# Patient Record
Sex: Male | Born: 1950 | Race: Black or African American | Hispanic: No | Marital: Married | Smoking: Current every day smoker
Health system: Southern US, Community
[De-identification: ages and names within clinical notes are randomized; demographics above are authoritative.]

## PROBLEM LIST (undated history)

## (undated) DIAGNOSIS — G8929 Other chronic pain: Secondary | ICD-10-CM

## (undated) DIAGNOSIS — R569 Unspecified convulsions: Secondary | ICD-10-CM

## (undated) DIAGNOSIS — I10 Essential (primary) hypertension: Secondary | ICD-10-CM

## (undated) DIAGNOSIS — M542 Cervicalgia: Secondary | ICD-10-CM

## (undated) DIAGNOSIS — I739 Peripheral vascular disease, unspecified: Secondary | ICD-10-CM

## (undated) DIAGNOSIS — I639 Cerebral infarction, unspecified: Secondary | ICD-10-CM

## (undated) DIAGNOSIS — K219 Gastro-esophageal reflux disease without esophagitis: Secondary | ICD-10-CM

## (undated) DIAGNOSIS — F419 Anxiety disorder, unspecified: Secondary | ICD-10-CM

## (undated) HISTORY — PX: NECK SURGERY: SHX720

## (undated) HISTORY — PX: TOE AMPUTATION: SHX809

## (undated) HISTORY — PX: CHOLECYSTECTOMY: SHX55

## (undated) HISTORY — PX: FEMORAL-POPLITEAL BYPASS GRAFT: SHX937

---

## 2001-12-30 ENCOUNTER — Ambulatory Visit (HOSPITAL_COMMUNITY): Admission: RE | Admit: 2001-12-30 | Discharge: 2001-12-30 | Payer: Self-pay | Admitting: Pulmonary Disease

## 2002-01-07 ENCOUNTER — Ambulatory Visit (HOSPITAL_COMMUNITY): Admission: RE | Admit: 2002-01-07 | Discharge: 2002-01-07 | Payer: Self-pay | Admitting: Pulmonary Disease

## 2002-01-15 ENCOUNTER — Ambulatory Visit (HOSPITAL_COMMUNITY): Admission: RE | Admit: 2002-01-15 | Discharge: 2002-01-15 | Payer: Self-pay | Admitting: Pulmonary Disease

## 2002-06-12 ENCOUNTER — Encounter: Payer: Self-pay | Admitting: Emergency Medicine

## 2002-06-12 ENCOUNTER — Emergency Department (HOSPITAL_COMMUNITY): Admission: EM | Admit: 2002-06-12 | Discharge: 2002-06-12 | Payer: Self-pay | Admitting: Emergency Medicine

## 2003-04-12 ENCOUNTER — Emergency Department (HOSPITAL_COMMUNITY): Admission: EM | Admit: 2003-04-12 | Discharge: 2003-04-12 | Payer: Self-pay | Admitting: Emergency Medicine

## 2003-04-12 ENCOUNTER — Encounter: Payer: Self-pay | Admitting: Emergency Medicine

## 2003-04-23 ENCOUNTER — Ambulatory Visit (HOSPITAL_COMMUNITY): Admission: RE | Admit: 2003-04-23 | Discharge: 2003-04-23 | Payer: Self-pay | Admitting: Internal Medicine

## 2003-04-23 ENCOUNTER — Encounter: Payer: Self-pay | Admitting: Internal Medicine

## 2003-06-15 ENCOUNTER — Ambulatory Visit (HOSPITAL_COMMUNITY): Admission: RE | Admit: 2003-06-15 | Discharge: 2003-06-16 | Payer: Self-pay | Admitting: Neurosurgery

## 2003-06-15 ENCOUNTER — Encounter: Payer: Self-pay | Admitting: Neurosurgery

## 2003-07-06 ENCOUNTER — Encounter: Payer: Self-pay | Admitting: Neurosurgery

## 2003-07-06 ENCOUNTER — Encounter: Admission: RE | Admit: 2003-07-06 | Discharge: 2003-07-06 | Payer: Self-pay | Admitting: Neurosurgery

## 2004-01-17 ENCOUNTER — Ambulatory Visit (HOSPITAL_COMMUNITY): Admission: RE | Admit: 2004-01-17 | Discharge: 2004-01-17 | Payer: Self-pay | Admitting: Neurosurgery

## 2012-03-25 ENCOUNTER — Emergency Department (HOSPITAL_COMMUNITY): Payer: Non-veteran care

## 2012-03-25 ENCOUNTER — Other Ambulatory Visit: Payer: Self-pay

## 2012-03-25 ENCOUNTER — Inpatient Hospital Stay (HOSPITAL_COMMUNITY)
Admission: EM | Admit: 2012-03-25 | Discharge: 2012-03-26 | DRG: 069 | Disposition: A | Discharge status: Home / Self Care | Payer: Non-veteran care | Source: Ambulatory Visit | Attending: Internal Medicine | Admitting: Internal Medicine

## 2012-03-25 ENCOUNTER — Encounter (HOSPITAL_COMMUNITY): Payer: Self-pay

## 2012-03-25 ENCOUNTER — Inpatient Hospital Stay (HOSPITAL_COMMUNITY): Payer: Non-veteran care

## 2012-03-25 DIAGNOSIS — E119 Type 2 diabetes mellitus without complications: Secondary | ICD-10-CM

## 2012-03-25 DIAGNOSIS — I10 Essential (primary) hypertension: Secondary | ICD-10-CM

## 2012-03-25 DIAGNOSIS — F1010 Alcohol abuse, uncomplicated: Secondary | ICD-10-CM | POA: Diagnosis present

## 2012-03-25 DIAGNOSIS — R569 Unspecified convulsions: Secondary | ICD-10-CM

## 2012-03-25 DIAGNOSIS — F17200 Nicotine dependence, unspecified, uncomplicated: Secondary | ICD-10-CM | POA: Diagnosis present

## 2012-03-25 DIAGNOSIS — R55 Syncope and collapse: Secondary | ICD-10-CM

## 2012-03-25 DIAGNOSIS — Z7289 Other problems related to lifestyle: Secondary | ICD-10-CM

## 2012-03-25 DIAGNOSIS — Z720 Tobacco use: Secondary | ICD-10-CM

## 2012-03-25 DIAGNOSIS — G459 Transient cerebral ischemic attack, unspecified: Principal | ICD-10-CM | POA: Diagnosis present

## 2012-03-25 HISTORY — DX: Cerebral infarction, unspecified: I63.9

## 2012-03-25 HISTORY — DX: Essential (primary) hypertension: I10

## 2012-03-25 LAB — COMPREHENSIVE METABOLIC PANEL
BUN: 8 mg/dL (ref 6–23)
CO2: 25 mEq/L (ref 19–32)
Chloride: 96 mEq/L (ref 96–112)
Creatinine, Ser: 0.82 mg/dL (ref 0.50–1.35)
GFR calc non Af Amer: >90 mL/min (ref >90)
Total Bilirubin: 0.3 mg/dL (ref 0.3–1.2)

## 2012-03-25 LAB — POCT I-STAT, CHEM 8
BUN: 8 mg/dL (ref 6–23)
Calcium, Ion: 1.22 mmol/L (ref 1.12–1.32)
Creatinine, Ser: 0.8 mg/dL (ref 0.50–1.35)
TCO2: 25 mmol/L (ref 0–100)

## 2012-03-25 LAB — CBC
HCT: 39.3 % (ref 39.0–52.0)
HCT: 38.6 % — ABNORMAL LOW (ref 39.0–52.0)
Hemoglobin: 13 g/dL (ref 13.0–17.0)
MCHC: 33.1 g/dL (ref 30.0–36.0)
MCHC: 32.1 g/dL (ref 30.0–36.0)
MCV: 76.6 fL — ABNORMAL LOW (ref 78.0–100.0)
MCV: 76.3 fL — ABNORMAL LOW (ref 78.0–100.0)
Platelets: 246 10*3/uL (ref 150–400)
RDW: 15.3 % (ref 11.5–15.5)
WBC: 7.1 10*3/uL (ref 4.0–10.5)

## 2012-03-25 LAB — DIFFERENTIAL
Basophils Relative: 0 % (ref 0–1)
Eosinophils Relative: 1 % (ref 0–5)
Lymphocytes Relative: 17 % (ref 12–46)
Monocytes Absolute: 0.8 10*3/uL (ref 0.1–1.0)
Monocytes Relative: 12 % (ref 3–12)
Neutro Abs: 4.8 10*3/uL (ref 1.7–7.7)

## 2012-03-25 LAB — APTT: aPTT: 25 seconds (ref 24–37)

## 2012-03-25 LAB — CARDIAC PANEL(CRET KIN+CKTOT+MB+TROPI)
CK, MB: 4.1 ng/mL — ABNORMAL HIGH (ref 0.3–4.0)
Relative Index: 0.9 (ref 0.0–2.5)
Relative Index: 0.5 (ref 0.0–2.5)
Total CK: 863 U/L — ABNORMAL HIGH (ref 7–232)
Troponin I: <0.3 ng/mL (ref <0.30)
Troponin I: <0.3 ng/mL (ref <0.30)

## 2012-03-25 LAB — CK TOTAL AND CKMB (NOT AT ARMC)
CK, MB: 3.3 ng/mL (ref 0.3–4.0)
Total CK: 199 U/L (ref 7–232)

## 2012-03-25 LAB — RAPID URINE DRUG SCREEN, HOSP PERFORMED
Barbiturates: NOT DETECTED
Tetrahydrocannabinol: NOT DETECTED

## 2012-03-25 LAB — GLUCOSE, CAPILLARY
Glucose-Capillary: 105 mg/dL — ABNORMAL HIGH (ref 70–99)
Glucose-Capillary: 124 mg/dL — ABNORMAL HIGH (ref 70–99)

## 2012-03-25 MED ORDER — ACETAMINOPHEN 325 MG PO TABS: 650.0000 mg | ORAL_TABLET | ORAL | Status: DC | PRN

## 2012-03-25 MED ORDER — OXYBUTYNIN CHLORIDE 5 MG PO TABS
5.0000 mg | ORAL_TABLET | Freq: Two times a day (BID) | ORAL | Status: DC
  Administered 2012-03-25 – 2012-03-26 (×2): 5 mg via ORAL
  Filled 2012-03-25 (×3): qty 1

## 2012-03-25 MED ORDER — BUPROPION HCL ER (SR) 150 MG PO TB12
150.0000 mg | ORAL_TABLET | Freq: Two times a day (BID) | ORAL | Status: DC
  Administered 2012-03-25 – 2012-03-26 (×2): 150 mg via ORAL
  Filled 2012-03-25 (×3): qty 1

## 2012-03-25 MED ORDER — IPRATROPIUM-ALBUTEROL 18-103 MCG/ACT IN AERO
2.0000 | INHALATION_SPRAY | Freq: Two times a day (BID) | RESPIRATORY_TRACT | Status: DC | PRN
  Administered 2012-03-25: 2 via RESPIRATORY_TRACT
  Filled 2012-03-25 (×2): qty 14.7

## 2012-03-25 MED ORDER — SODIUM CHLORIDE 0.9 % IV SOLN
INTRAVENOUS | Status: DC
  Administered 2012-03-25: 14:00:00 via INTRAVENOUS

## 2012-03-25 MED ORDER — ASPIRIN 325 MG PO TABS
325.0000 mg | ORAL_TABLET | Freq: Every day | ORAL | Status: DC
  Administered 2012-03-26: 325 mg via ORAL
  Filled 2012-03-25: qty 1

## 2012-03-25 MED ORDER — NICOTINE 14 MG/24HR TD PT24
14.0000 mg | MEDICATED_PATCH | Freq: Every day | TRANSDERMAL | Status: DC
  Administered 2012-03-25 – 2012-03-26 (×2): 14 mg via TRANSDERMAL
  Filled 2012-03-25 (×2): qty 1

## 2012-03-25 MED ORDER — INSULIN ASPART 100 UNIT/ML ~~LOC~~ SOLN: 0.0000 [IU] | Freq: Three times a day (TID) | SUBCUTANEOUS | Status: DC

## 2012-03-25 MED ORDER — WHITE PETROLATUM GEL
Status: AC
  Filled 2012-03-25: qty 5

## 2012-03-25 MED ORDER — INSULIN ASPART 100 UNIT/ML ~~LOC~~ SOLN
0.0000 [IU] | SUBCUTANEOUS | Status: DC
  Administered 2012-03-25: 1 [IU] via SUBCUTANEOUS

## 2012-03-25 MED ORDER — ATORVASTATIN CALCIUM 10 MG PO TABS
10.0000 mg | ORAL_TABLET | Freq: Every day | ORAL | Status: DC
  Administered 2012-03-25: 10 mg via ORAL
  Filled 2012-03-25 (×4): qty 1

## 2012-03-25 MED ORDER — GADOBENATE DIMEGLUMINE 529 MG/ML IV SOLN
20.0000 mL | Freq: Once | INTRAVENOUS | Status: AC
  Administered 2012-03-25: 20 mL via INTRAVENOUS

## 2012-03-25 MED ORDER — ENOXAPARIN SODIUM 40 MG/0.4ML ~~LOC~~ SOLN
40.0000 mg | SUBCUTANEOUS | Status: DC
  Administered 2012-03-25: 40 mg via SUBCUTANEOUS
  Filled 2012-03-25 (×2): qty 0.4

## 2012-03-25 MED ORDER — ASPIRIN EC 81 MG PO TBEC
81.0000 mg | DELAYED_RELEASE_TABLET | Freq: Every day | ORAL | Status: DC
  Administered 2012-03-25: 81 mg via ORAL
  Filled 2012-03-25: qty 1

## 2012-03-25 NOTE — H&P (Signed)
Hospital Admission Note Date: 03/25/2012  Patient name: Ruben Lloyd Medical record number: [id number] Date of birth: 1951-06-23 Age: 61 y.o. Gender: male PCP: No primary provider on file.  Medical Service: Maurice March  Attending physician: Dr. Franky Macho Hours (7AM-5PM):  First Contact: Dr. Clyde Lundborg                            Pager: [phone] Second Contact: Dr. Saralyn Pilar    Pager: [phone]  After Hours (after 5PM)/ Weekend / Holidays: First Contact: Pager: [phone] Second Contact: Pager: [phone]   Chief Complaint: Blurry vision, LOC  History of Present Illness: Patient is a 61 year old male with a past medical history of hypertension, diabetes, alcohol and tobacco use, woke up this morning and normal state of health, later that morning he developed blurry vision, patient proceeded to go to the store, when he returned home he went to the mailbox, when he had a witnessed syncopal episode and some shaking motion noted by his wife. Patient's wife called EMS which transported the patient to [facility] emergency room. Patient reported that he regained consciousness during the ambulance ride, denied any confusion, palpitation, shortness of breath, chest pain, diplopia or headache. Underwent head CT that showed an old area of ischemia in the right occipital lobe, no sign of acute bleed, no masses noted. The patient had returned to his baseline function no TPA was considered by neurology, but recommended admission for further workup of possible transient ischemic attack.   Meds: Medication List  As of 03/25/2012 10:23 AM   ASK your doctor about these medications         albuterol-ipratropium 18-103 MCG/ACT inhaler   Commonly known as: COMBIVENT   Inhale 2 puffs into the lungs 2 (two) times daily as needed. For shortness of breath      buPROPion 150 MG 12 hr tablet   Commonly known as: WELLBUTRIN SR   Take 150 mg by mouth 2 (two) times daily.      ibuprofen 800 MG tablet   Commonly known as:  ADVIL,MOTRIN   Take 800 mg by mouth 2 (two) times daily as needed.      oxybutynin 5 MG tablet   Commonly known as: DITROPAN   Take 5 mg by mouth 2 (two) times daily.      PRESCRIPTION MEDICATION   Take 1 tablet by mouth. Blood Pressure Medication from [facility]           Allergies: Allergies as of 03/25/2012  . (Not on File)   Past Medical History  Diagnosis Date  . Hypertension   . Diabetes mellitus   . Stroke    No past surgical history on file. No family history on file. History   Social History  . Marital Status: Married    Spouse Name: N/A    Number of Children: N/A  . Years of Education: N/A   Occupational History  . Not on file.   Social History Main Topics  . Smoking status: Current Everyday Smoker  . Smokeless tobacco: Not on file  . Alcohol Use: Yes     daily  . Drug Use:   . Sexually Active:    Other Topics Concern  . Not on file   Social History Narrative  . No narrative on file    Review of Systems: Negative except per history of present illness  Physical Exam: Blood pressure 149/80, pulse 90, temperature 98.6 F (  37 C), temperature source Oral, resp. rate 20, SpO2 100.00%. General:  alert, well-developed, and cooperative to examination.   Head:  normocephalic and atraumatic.   Eyes:  vision grossly intact, pupils equal, pupils round, pupils reactive to light, no injection and anicteric.   Mouth:  pharynx pink and moist, no erythema, and no exudates.   Neck:  supple, full ROM, no thyromegaly, no JVD, and no carotid bruits.   Lungs:  normal respiratory effort, no accessory muscle use, normal breath sounds, no crackles, and no wheezes. Heart:  normal rate, regular rhythm, no murmur, no gallop, and no rub.   Abdomen:  soft, non-tender, normal bowel sounds, no distention, no guarding, no rebound tenderness, no hepatomegaly, and no splenomegaly.   Msk:  no joint swelling, no joint warmth, and no redness over joints.   Pulses:  2+ DP/PT  pulses bilaterally Extremities:  No cyanosis, clubbing, edema Neurologic:  alert & oriented X3, cranial nerves II-XII intact, strength normal in all extremities, sensation intact to light touch, and gait normal.   Skin:  turgor normal and no rashes.   Psych:  Oriented X3, memory intact for recent and remote, normally interactive, good eye contact, not anxious appearing, and not depressed appearing.  Lab results: Basic Metabolic Panel:  Basename 03/25/12 0913 03/25/12 0853  NA 136 133*  K 4.1 4.1  CL 102 96  CO2 -- 25  GLUCOSE 124* 116*  BUN 8 8  CREATININE 0.80 0.82  CALCIUM -- 9.8  MG -- --  PHOS -- --   Liver Function Tests:  [facility] 03/25/12 0853  AST 18  ALT 9  ALKPHOS 78  BILITOT 0.3  PROT 8.0  ALBUMIN 3.9   CBC:  Basename 03/25/12 0913 03/25/12 0853  WBC -- 6.9  NEUTROABS -- 4.8  HGB 15.0 13.0  HCT 44.0 39.3  MCV -- 76.6*  PLT -- 262   Cardiac Enzymes:  Basename 03/25/12 0853  CKTOTAL PENDING  CKMB 3.3  CKMBINDEX --  TROPONINI <0.30   Coagulation:  Basename 03/25/12 0853  LABPROT 13.1  INR 0.97   Urine Drug Screen: Pending>>  Imaging results:  Ct Head Wo Contrast  03/25/2012  *RADIOLOGY REPORT*  Clinical Data: 61 year old male Code stroke.  Dizziness, syncope, confusion, incontinence.  CT HEAD WITHOUT CONTRAST  Technique:  Contiguous axial images were obtained from the base of the skull through the vertex without contrast.  Comparison: Cervical MRI 01/17/2004.  Findings: Visualized paranasal sinuses and mastoids are clear.  No acute orbit or scalp soft tissue findings.  Small right scalp lipoma.  Mild Calcified atherosclerosis at the skull base.  No ventriculomegaly. No midline shift, mass effect, or evidence of mass lesion.  No acute intracranial hemorrhage identified.  Chronic right parietal lobe infarct on series 2 image 18. Heterogeneously hypodense right occipital pole on image 13.  No associated mass effect or hemorrhage.  Suggestion of a small  lacunar infarct of the right thalamus also on image 13.  Elsewhere gray white matter differentiation within normal limits. No suspicious intracranial vascular hyperdensity.  IMPRESSION: 1.  Small chronic right parietal lobe infarct with nearby age indeterminate right occipital infarct (image 13).  No mass effect or hemorrhage. 2.  Evidence of a small lacunar infarct of the right thalamus, also age indeterminate.  Study discussed by telephone with Dr. Nelva Nay on 03/25/2012 at 0910 hours.  Original Report Authenticated By: Harley Hallmark, M.D.    Other results: EKG: Sinus rhythm, right bundle branch block, no definite ischemic  changes noted, no prior EKGs to compare.  Assessment & Plan by Problem:  TRANSIENT ISCHEMIC ATTACK: Total event with questionable seizure activity with resolution of symptoms within one hour after presentation suggest TIA as most likely etiology. Patient's risk factors for vascular disease include hypertension, diabetes, regular tobacco and Alcohol use. Patient's ABCD2 score is 3, which puts him at low-moderate or 3.1% risk of progression to stroke at 90 days.  Plan -Admit to Neuro floor with telemetry and core measures recommended protocol -Order brain MRI/MRA per neurology recs, if new stoke present, will replace ASA with Plavix.  -Will further evaluate for embolic source by ordering carotid dopplers, and 2D echo.   -Start patient on ASA and statin empirically and check AM fasting lipid.  -Other etiologies for brain ischemia include cardiac ischemia or cardiac arrhythmia, therefore will check cardiac enzymes x3, as patient may be having silent MI which is increased in patient with coronary risk factors (eg, smoking, family history of heart disease, age over 45 years, diabetes, hypertension, and hypercholesterolemia).   HTN: Hold antihypertensives to allow for permissive hypertension in the setting of possible new stroke.   Tobacco use: smokes 1PPD, place the patient on  nicotine patch and obtain social work consult for cessation counseling.   DM2: Diet-controlled at home, check A1c and place the patient on sliding scale insulin.  Alcohol use: Place patient on CIWA  DVT: Lovenox   Signed: Nillie Bartolotta 03/25/2012, 10:09 AM

## 2012-03-25 NOTE — ED Notes (Signed)
Paramedics arrived to pt.s home and pt.s wife reported that pt. Went to the store this am,. Came home was in the yard and pt. Was shaking and incontinent of urine.  Wife brought pt. To the couch and he became unreponsive for a few seconds. When paramedic arrived pt. Was having lt. Arm weakness and rt. Sided glare. And a BP of 210/100.   Upon arrival to ED pt. Has no weakness noted to extremeties, pt. Is alert and oriented X3.

## 2012-03-25 NOTE — Code Documentation (Signed)
Code stroke called 0829 Code stroke encoded 0830 Patient arrival 0846 EDP 0846 Stroke team arrival 0840 LSN 0730 Pt arrival in [place] [phone] Phlebotomist arrival [phone]
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
## 2012-03-25 NOTE — Consult Note (Signed)
[facility] CONSULT NOTE     Reason for Consult: rule out stroke    HPI:    Ruben Lloyd is an 61 y.o. male who was in his usual state of health on arising this morn.  When he returned from a store back home around 7:45AM, he remembers going to the mailbox but then not after that.  His wife told others that she saw him in the yard passed out and with some shaking on something.  His wife is in route and not available to get a better history of what she saw.  I was told she helped him into the house and then he became unresponsive.  EMS was called and he was transported to [facility] ED.  He is denying and focal weakness or change in sensations, problems with vision or side vision, spinning, nausea, vomiting, palpitations or racing heart, chest pain (does have occasional gas), diplopia, headache (except a mild dullness bifrontally).  In the ED, he underwent a head CT (personally reviewed) that showed an old area of ischemia in the right occipital lobe with some mixed densities, but appears old; no shift; no ICH; no masses.  He says he feels at his baseline except speech "a little sluggish."  No tpa was considered as his exam was unremarkable and it wasn't clear whether he had had a seizure or just some shaking or just passed out.  He normally receives his care at a [facility].  PMH: HTN Smoker Denies diabetes (except said told to watch diet); denies CAD. Denies any previous stroke symptoms or change in vision or side vision.  No past surgical history on file.  No family history on file.   Social History: Positive for tobacco use and smokeless cigarette use.  Negative for alcohol or drug abuse.  Lives with his wife in [place], [place].  Allergies not on file  Medications:    1.  BP pill 2.  ASA 81 mg/day Full list pending.  Prior to Admission:  (Not in a hospital admission)  Review of Systems - see HPI.  Blood pressure 149/80, pulse 90, temperature 98.6  F (37 C), temperature source Oral, resp. rate 20, SpO2 100.00%. O2 sat on RA was 88%.  Placed on O2 via [place].  Neurologic Examination:   Awake, alert, INAD.  Oriented to person, place, follows all directions.  Language normal.  No significant dysarthria but he feels his speech may be a little sluggish.  No scanning or apraxic speech.  Does not look directly at the examiner. No vertebral bruits auscultated. PERRL.  EOM full.  + nystagmus on looking to the right with fast phase to the right.  Same minimally true looking to the left.  VFF to DSS by confrontation.  No facial asymmetry. No ptosis.  Light touch and temperature intact and symmetrical V2 areas.  Hearing intact to general conversation.   Power 5/5 proximally and distally in the UEs and LEs.  Bulk and tone normal. Light touch, temperature and vibration intact and symmetrical in the UEs and LEs. DTRs trace at the knees and not elicitable at the biceps.  Normal FTN without any dysmetria.  No tremors noted.    Results for orders placed during the hospital encounter of 03/25/12 (from the past 48 hour(s))  PROTIME-INR     Status: Normal   Collection Time   03/25/12  8:53 AM      Component Value Range Comment   Prothrombin Time 13.1  11.6 - 15.2 (seconds)    INR 0.97  0.00 - 1.49    APTT     Status: Normal   Collection Time   03/25/12  8:53 AM      Component Value Range Comment   aPTT 25  24 - 37 (seconds)   CBC     Status: Abnormal   Collection Time   03/25/12  8:53 AM      Component Value Range Comment   WBC 6.9  4.0 - 10.5 (K/uL)    RBC 5.13  4.22 - 5.81 (MIL/uL)    Hemoglobin 13.0  13.0 - 17.0 (g/dL)    HCT 16.1  09.6 - 04.5 (%)    MCV 76.6 (*) 78.0 - 100.0 (fL)    MCH 25.3 (*) 26.0 - 34.0 (pg)    MCHC 33.1  30.0 - 36.0 (g/dL)    RDW 40.9  81.1 - 91.4 (%)    Platelets 262  150 - 400 (K/uL)   DIFFERENTIAL     Status: Normal   Collection Time   03/25/12  8:53 AM      Component Value Range Comment   Neutrophils Relative 69  43 -  77 (%)    Neutro Abs 4.8  1.7 - 7.7 (K/uL)    Lymphocytes Relative 17  12 - 46 (%)    Lymphs Abs 1.2  0.7 - 4.0 (K/uL)    Monocytes Relative 12  3 - 12 (%)    Monocytes Absolute 0.8  0.1 - 1.0 (K/uL)    Eosinophils Relative 1  0 - 5 (%)    Eosinophils Absolute 0.1  0.0 - 0.7 (K/uL)    Basophils Relative 0  0 - 1 (%)    Basophils Absolute 0.0  0.0 - 0.1 (K/uL)   CK TOTAL AND CKMB     Status: Normal (Preliminary result)   Collection Time   03/25/12  8:53 AM      Component Value Range Comment   Total CK PENDING  7 - 232 (U/L)    CK, MB 3.3  0.3 - 4.0 (ng/mL)    Relative Index PENDING  0.0 - 2.5    TROPONIN I     Status: Normal   Collection Time   03/25/12  8:53 AM      Component Value Range Comment   Troponin I <0.30  <0.30 (ng/mL)   POCT I-STAT, CHEM 8     Status: Abnormal   Collection Time   03/25/12  9:13 AM      Component Value Range Comment   Sodium 136  135 - 145 (mEq/L)    Potassium 4.1  3.5 - 5.1 (mEq/L)    Chloride 102  96 - 112 (mEq/L)    BUN 8  6 - 23 (mg/dL)    Creatinine, Ser 7.82  0.50 - 1.35 (mg/dL)    Glucose, Bld 956 (*) 70 - 99 (mg/dL)    Calcium, Ion 2.13  1.12 - 1.32 (mmol/L)    TCO2 25  0 - 100 (mmol/L)    Hemoglobin 15.0  13.0 - 17.0 (g/dL)    HCT 08.6  57.8 - 46.9 (%)    EKG:  RBBB.  Ct Head Wo Contrast  03/25/2012  *RADIOLOGY REPORT*  Clinical Data: 61 year old male Code stroke.  Dizziness, syncope, confusion, incontinence.  CT HEAD WITHOUT CONTRAST  Technique:  Contiguous axial images were obtained from  the base of the skull through the vertex without contrast.  Comparison: Cervical MRI 01/17/2004.  Findings: Visualized paranasal sinuses and mastoids are clear.  No acute orbit or scalp soft tissue findings.  Small right scalp lipoma.  Mild Calcified atherosclerosis at the skull base.  No ventriculomegaly. No midline shift, mass effect, or evidence of mass lesion.  No acute intracranial hemorrhage identified.  Chronic right parietal lobe infarct on series 2 image  18. Heterogeneously hypodense right occipital pole on image 13.  No associated mass effect or hemorrhage.  Suggestion of a small lacunar infarct of the right thalamus also on image 13.  Elsewhere gray white matter differentiation within normal limits. No suspicious intracranial vascular hyperdensity.  IMPRESSION: 1.  Small chronic right parietal lobe infarct with nearby age indeterminate right occipital infarct (image 13).  No mass effect or hemorrhage. 2.  Evidence of a small lacunar infarct of the right thalamus, also age indeterminate.  Study discussed by telephone with Dr. Nelva Nay on 03/25/2012 at 0910 hours.  Original Report Authenticated By: Harley Hallmark, M.D.     Assessment/Plan:   1.  Syncopy vs seizure vs new CNS ischemic event.  Regarding the latter, he doesn't really have much on exam to suggest permanent ischemia (except there is some nystagmus).  He is denying any clinical stroke symptoms.  Event occurred on ASA. 2.  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  HTN 4.  Hyperglycemia  Recommendations: 1.  MRI Brain with and without dye today. 2.  Telemetry monitoring. 3.  HgA1c and FLP. 4.  Glucose monitoring. 5.  BP goal of SBP > or = 140 and < or = 180. 6.  Neuro checks. 7.  Further pending results of the above. 8.  If MRI suggests new cerebral ischemia, consider change to Plavix as event occurred on ASA.   Hayden Rasmussen, MD [facility] [phone]  03/25/2012, 9:43 AM

## 2012-03-25 NOTE — Consult Note (Signed)
Pt smokes 1 ppd and wants to quit. He would like to quit on patches and is in action stage. Recommended for pt to start with 21 mg patch. Discussed patch use instructions and how to taper. Pt verbalizes understanding. Referred to 1-800 quit now for f/u and support. Discussed oral fixation substitutes, second hand smoke and in home smoking policy. Reviewed and gave pt Written education/contact information.

## 2012-03-25 NOTE — ED Notes (Signed)
Pt. Is alert to himself  And place, but is confused to time.   Pt. Does follow commands

## 2012-03-25 NOTE — ED Notes (Signed)
Spoke with French Ana , RN on 3000, pt. To be transferrred to MRI and then they will call her when he is done.

## 2012-03-26 DIAGNOSIS — I517 Cardiomegaly: Secondary | ICD-10-CM

## 2012-03-26 DIAGNOSIS — R55 Syncope and collapse: Secondary | ICD-10-CM

## 2012-03-26 LAB — LIPID PANEL
HDL: 45 mg/dL (ref >39)
LDL Cholesterol: 113 mg/dL — ABNORMAL HIGH (ref 0–99)
Total CHOL/HDL Ratio: 4.1 RATIO
Triglycerides: 129 mg/dL (ref <150)

## 2012-03-26 LAB — BASIC METABOLIC PANEL
BUN: 10 mg/dL (ref 6–23)
Chloride: 98 mEq/L (ref 96–112)
GFR calc Af Amer: >90 mL/min (ref >90)
Glucose, Bld: 93 mg/dL (ref 70–99)
Potassium: 4 mEq/L (ref 3.5–5.1)
Sodium: 135 mEq/L (ref 135–145)

## 2012-03-26 LAB — GLUCOSE, CAPILLARY: Glucose-Capillary: 107 mg/dL — ABNORMAL HIGH (ref 70–99)

## 2012-03-26 MED ORDER — ATORVASTATIN CALCIUM 10 MG PO TABS: 10.0000 mg | ORAL_TABLET | Freq: Every day | ORAL | Status: DC

## 2012-03-26 MED ORDER — ASPIRIN 325 MG PO TABS: 325.0000 mg | ORAL_TABLET | Freq: Every day | ORAL | Status: AC

## 2012-03-26 MED ORDER — NICOTINE 14 MG/24HR TD PT24: 1.0000 | MEDICATED_PATCH | Freq: Every day | TRANSDERMAL | Status: AC

## 2012-03-26 NOTE — ED Provider Notes (Signed)
History     CSN: [id number]  Arrival date & time 03/25/12  7829   First MD Initiated Contact with Patient 03/25/12 [phone]      Chief Complaint  Patient presents with  . Code Stroke     HPI Paramedics arrived to pt.s home and pt.s wife reported that pt. Went to the store this am,. Came home was in the yard and pt. Was shaking and incontinent of urine. Wife brought pt. To the couch and he became unreponsive for a few seconds. When paramedic arrived pt. Was having lt. Arm weakness and rt. Sided glare. And a BP of 210/100. Upon arrival to ED pt. Has no weakness noted to extremeties, pt. Is alert and oriented X3  Past Medical History  Diagnosis Date  . Hypertension   . Diabetes mellitus   . Stroke     No past surgical history on file.  No family history on file.  History  Substance Use Topics  . Smoking status: Current Everyday Smoker  . Smokeless tobacco: Not on file  . Alcohol Use: Yes     daily      Review of Systems  Allergies  Review of patient's allergies indicates no known allergies.  Home Medications  No current outpatient prescriptions on file.  BP 115/68  Pulse 90  Temp(Src) 97.8 F (36.6 C) (Oral)  Resp 18  SpO2 95%  Physical Exam  Nursing note and vitals reviewed. Constitutional: He appears well-developed and well-nourished. No distress.  HENT:  Head: Normocephalic and atraumatic.  Eyes: Pupils are equal, round, and reactive to light.  Neck: Normal range of motion.  Cardiovascular: Normal rate and intact distal pulses.   Pulmonary/Chest: Effort normal and breath sounds normal. No respiratory distress.  Abdominal: Normal appearance and bowel sounds are normal. He exhibits no distension.  Musculoskeletal: Normal range of motion.  Neurological: No cranial nerve deficit.       Awake, alert, INAD.  Oriented to person, place, follows all directions.  Language normal.  No significant dysarthria but he feels his speech may be a little sluggish.  No  scanning or apraxic speech.  Does not look directly at the examiner. No vertebral bruits auscultated. PERRL.  EOM full.  + nystagmus on looking to the right with fast phase to the right.  Same minimally true looking to the left.  VFF to DSS by confrontation.  No facial asymmetry. No ptosis.  Light touch and temperature intact and symmetrical V2 areas.  Hearing intact to general conversation.    Power 5/5 proximally and distally in the UEs and LEs.  Bulk and tone normal. Light touch, temperature and vibration intact and symmetrical in the UEs and LEs. DTRs trace at the knees and not elicitable at the biceps.   Normal FTN without any dysmetria.  No tremors noted.   Skin: Skin is warm and dry. No rash noted.  Psychiatric: He has a normal mood and affect. His behavior is normal.    ED Course  Procedures (including critical care time)  Labs Reviewed  CBC - Abnormal; Notable for the following:    MCV 76.6 (*)    MCH 25.3 (*)    All other components within normal limits  COMPREHENSIVE METABOLIC PANEL - Abnormal; Notable for the following:    Sodium 133 (*)    Glucose, Bld 116 (*)    All other components within normal limits  POCT I-STAT, CHEM 8 - Abnormal; Notable for the following:    Glucose, Bld 124 (*)  All other components within normal limits  GLUCOSE, CAPILLARY - Abnormal; Notable for the following:    Glucose-Capillary 105 (*)    All other components within normal limits  HEMOGLOBIN A1C - Abnormal; Notable for the following:    Hemoglobin A1C 6.3 (*)    Mean Plasma Glucose 134 (*)    All other components within normal limits  CBC - Abnormal; Notable for the following:    Hemoglobin 12.4 (*) DELTA CHECK NOTED   HCT 38.6 (*)    MCV 76.3 (*)    MCH 24.5 (*)    All other components within normal limits  CARDIAC PANEL(CRET KIN+CKTOT+MB+TROPI) - Abnormal; Notable for the following:    Total CK 376 (*)    All other components within normal limits  CARDIAC PANEL(CRET  KIN+CKTOT+MB+TROPI) - Abnormal; Notable for the following:    Total CK 863 (*)    CK, MB 4.1 (*)    All other components within normal limits  GLUCOSE, CAPILLARY - Abnormal; Notable for the following:    Glucose-Capillary 126 (*)    All other components within normal limits  CARDIAC PANEL(CRET KIN+CKTOT+MB+TROPI) - Abnormal; Notable for the following:    Total CK 1041 (*)    CK, MB 4.2 (*)    All other components within normal limits  LIPID PANEL - Abnormal; Notable for the following:    LDL Cholesterol 113 (*)    All other components within normal limits  GLUCOSE, CAPILLARY - Abnormal; Notable for the following:    Glucose-Capillary 124 (*)    All other components within normal limits  PROTIME-INR  APTT  DIFFERENTIAL  CK TOTAL AND CKMB  TROPONIN I  URINE RAPID DRUG SCREEN (HOSP PERFORMED)  CREATININE, SERUM  BASIC METABOLIC PANEL  GLUCOSE, POCT (MANUAL RESULT ENTRY)  GLUCOSE, POCT (MANUAL RESULT ENTRY)  HEMOGLOBIN A1C  GLUCOSE, POCT (MANUAL RESULT ENTRY)  GLUCOSE, POCT (MANUAL RESULT ENTRY)  GLUCOSE, POCT (MANUAL RESULT ENTRY)  GLUCOSE, POCT (MANUAL RESULT ENTRY)   Ct Head Wo Contrast  03/25/2012  *RADIOLOGY REPORT*  Clinical Data: 61 year old male Code stroke.  Dizziness, syncope, confusion, incontinence.  CT HEAD WITHOUT CONTRAST  Technique:  Contiguous axial images were obtained from the base of the skull through the vertex without contrast.  Comparison: Cervical MRI 01/17/2004.  Findings: Visualized paranasal sinuses and mastoids are clear.  No acute orbit or scalp soft tissue findings.  Small right scalp lipoma.  Mild Calcified atherosclerosis at the skull base.  No ventriculomegaly. No midline shift, mass effect, or evidence of mass lesion.  No acute intracranial hemorrhage identified.  Chronic right parietal lobe infarct on series 2 image 18. Heterogeneously hypodense right occipital pole on image 13.  No associated mass effect or hemorrhage.  Suggestion of a small lacunar  infarct of the right thalamus also on image 13.  Elsewhere gray white matter differentiation within normal limits. No suspicious intracranial vascular hyperdensity.  IMPRESSION: 1.  Small chronic right parietal lobe infarct with nearby age indeterminate right occipital infarct (image 13).  No mass effect or hemorrhage. 2.  Evidence of a small lacunar infarct of the right thalamus, also age indeterminate.  Study discussed by telephone with Dr. Nelva Nay on 03/25/2012 at 0910 hours.  Original Report Authenticated By: Harley Hallmark, M.D.   [name] Wo Contrast  03/25/2012  *RADIOLOGY REPORT*  Clinical Data: Confusion with dizziness and headaches.  Syncope with possible seizure or new stroke.  Hypertension and diabetes.  MRI HEAD WITHOUT AND WITH CONTRAST  Technique:  Multiplanar, multiecho pulse sequences of the brain and surrounding structures were obtained according to standard protocol without and with intravenous contrast  Contrast: 20mL MULTIHANCE GADOBENATE DIMEGLUMINE 529 MG/ML IV SOLN  Comparison: 03/25/2012 CT.  No comparison MR.  Findings: No acute infarct.  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 Remote tiny right thalamic infarct.  Mild small vessel disease type changes.  No intracranial mass or abnormal enhancement.  Global atrophy without hydrocephalus.  Spinal stenosis C2-3.  Subcutaneous lipoma occipital region into the right mid lung.  Prominent soft tissue more than expected for the patient's age. This may represent adenoidal tissue and therefore lymphoma not excluded.  Primary nasopharyngeal mucosa abnormality not excluded.  Major intracranial vascular structures are patent.  Atherosclerotic type changes left vertebral artery suspected.  Exophthalmos.  IMPRESSION: No acute infarct.  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 Remote tiny right thalamic infarct.  Mild small vessel disease type changes.  No intracranial mass or abnormal  enhancement.  Please see above.  Original Report Authenticated By: Fuller Canada, M.D.   Dg Chest Portable 1 View  03/25/2012  *RADIOLOGY REPORT*  Clinical Data: Code stroke.  Dizziness and syncope  PORTABLE CHEST - 1 VIEW  Comparison: None.  Findings: Heart size is normal.  No pleural effusion or edema.  No airspace consolidation identified.  IMPRESSION:  1.  No acute cardiopulmonary abnormalities.  Original Report Authenticated By: Rosealee Albee, M.D.     1. TIA (transient ischemic attack)   2. Hypertension       MDM  Patient seen and evaluated by neurology.       Nelia Shi, MD 03/26/12 [phone]

## 2012-03-26 NOTE — Discharge Instructions (Signed)
1. Please follow-up with your Doctor in [place] hospital in 10 days. 2. Please take all medications as prescribed.  3. If you have worsening of your symptoms or new symptoms arise, please call the clinic ([phone]), or go to the ER immediately if symptoms are severe.

## 2012-03-26 NOTE — Progress Notes (Signed)
*  PRELIMINARY RESULTS* Echocardiogram 2D Echocardiogram has been performed.  Ruben Lloyd [facility] 03/26/2012, 10:56 AM

## 2012-03-26 NOTE — Progress Notes (Signed)
VASCULAR LAB PRELIMINARY  PRELIMINARY  PRELIMINARY  PRELIMINARY  Carotid duplex  completed.    Preliminary report:  Bilateral:  No evidence of hemodynamically significant internal carotid artery stenosis.   Vertebral artery flow is antegrade.      Terance Hart, RVT 03/26/2012, 11:01 AM

## 2012-03-26 NOTE — H&P (Signed)
IM Attending  The computer destroyed a long note I had nearly finished.  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  No sequela.  Old strokes possible nidus. Only important issue is abstinence.  Doubt feasibility of anti-convulsant rx.  If rest of w/u is negative, please refer his follow-up to his medical home at the [facility].

## 2012-03-26 NOTE — Discharge Summary (Signed)
Patient Name:  Ruben Lloyd  MRN: [id number]  PCP: No primary provider on file.  DOB:  09-13-1951   MRN: [id number] CSN: [id number]     Date of Admission:  03/25/2012  Date of Discharge:  03/26/2012      Attending Physician: Dr. Ulyess Mort, MD    DISCHARGE DIAGNOSES: Active Problems:  HTN (hypertension)  DM (diabetes mellitus)  Tobacco use  Alcohol use  Seizure   DISPOSITION AND FOLLOW-UP: Ruben Lloyd is to follow-up with his doctor in [facility] He promised that he will call to make an appointment in 10 days. [facility]  In his visit, please address following issues:   1. Please make sure that patient does not have any episode of seizure. 2. Please continue to educate patient to quit drinking alcohol and using tobacco. 3. Given his abnormal 2 D echo, he may need to have cardiology referral.  Follow-up Information    Please follow up. (As you promised[facility] for [facility] up visit.)         Discharge Orders    Future Orders Please Complete By Expires   Diet Carb Modified      Increase activity slowly      Call MD for:  extreme fatigue      Call MD for:  persistant dizziness or light-headedness      Call MD for:  temperature >100.4          DISCHARGE MEDICATIONS: Medication List  As of 03/28/2012  6:22 PM   STOP taking these medications         buPROPion 150 MG 12 hr tablet         TAKE these medications         albuterol-ipratropium 18-103 MCG/ACT inhaler   Commonly known as: COMBIVENT   Inhale 2 puffs into the lungs 2 (two) times daily as needed. For shortness of breath      aspirin 325 MG tablet   Take 1 tablet (325 mg total) by mouth daily.      atorvastatin 10 MG tablet   Commonly known as: LIPITOR   Take 1 tablet (10 mg total) by mouth daily at 6 PM.      ibuprofen 800 MG tablet   Commonly known as: ADVIL,MOTRIN   Take 800 mg by mouth 2 (two)  times daily as needed.      lisinopril-hydrochlorothiazide 10-12.5 MG per tablet   Commonly known as: PRINZIDE,ZESTORETIC   Take 1 tablet by mouth daily.      nicotine 14 mg/24hr patch   Commonly known as: NICODERM CQ - dosed in mg/24 hours   Place 1 patch onto the skin daily.      oxybutynin 5 MG tablet   Commonly known as: DITROPAN   Take 5 mg by mouth 2 (two) times daily.             CONSULTS:     Neurologoy   PROCEDURES PERFORMED:  Ct Head Wo Contrast  03/25/2012  *RADIOLOGY REPORT*  Clinical Data: 61 year old male Code stroke.  Dizziness, syncope, confusion, incontinence.  CT HEAD WITHOUT CONTRAST  Technique:  Contiguous axial images were obtained from the base of the skull through the vertex without contrast.  Comparison: Cervical MRI 01/17/2004.  Findings: Visualized paranasal sinuses and mastoids are clear.  No acute orbit or scalp soft tissue findings.  Small right scalp  lipoma.  Mild Calcified atherosclerosis at the skull base.  No ventriculomegaly. No midline shift, mass effect, or evidence of mass lesion.  No acute intracranial hemorrhage identified.  Chronic right parietal lobe infarct on series 2 image 18. Heterogeneously hypodense right occipital pole on image 13.  No associated mass effect or hemorrhage.  Suggestion of a small lacunar infarct of the right thalamus also on image 13.  Elsewhere gray white matter differentiation within normal limits. No suspicious intracranial vascular hyperdensity.  IMPRESSION: 1.  Small chronic right parietal lobe infarct with nearby age indeterminate right occipital infarct (image 13).  No mass effect or hemorrhage. 2.  Evidence of a small lacunar infarct of the right thalamus, also age indeterminate.  Study discussed by telephone with Dr. Nelva Nay on 03/25/2012 at 0910 hours.  Original Report Authenticated By: Harley Hallmark, M.D.   [name] Wo Contrast  03/25/2012  *RADIOLOGY REPORT*  Clinical Data: Confusion with dizziness and  headaches.  Syncope with possible seizure or new stroke.  Hypertension and diabetes.  MRI HEAD WITHOUT AND WITH CONTRAST  Technique:  Multiplanar, multiecho pulse sequences of the brain and surrounding structures were obtained according to standard protocol without and with intravenous contrast  Contrast: 20mL MULTIHANCE GADOBENATE DIMEGLUMINE 529 MG/ML IV SOLN  Comparison: 03/25/2012 CT.  No comparison MR.  Findings: No acute infarct.  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 Remote tiny right thalamic infarct.  Mild small vessel disease type changes.  No intracranial mass or abnormal enhancement.  Global atrophy without hydrocephalus.  Spinal stenosis C2-3.  Subcutaneous lipoma occipital region into the right mid lung.  Prominent soft tissue more than expected for the patient's age. This may represent adenoidal tissue and therefore lymphoma not excluded.  Primary nasopharyngeal mucosa abnormality not excluded.  Major intracranial vascular structures are patent.  Atherosclerotic type changes left vertebral artery suspected.  Exophthalmos.  IMPRESSION: No acute infarct.  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 Remote tiny right thalamic infarct.  Mild small vessel disease type changes.  No intracranial mass or abnormal enhancement.  Please see above.  Original Report Authenticated By: Fuller Canada, M.D.   Dg Chest Portable 1 View  03/25/2012  *RADIOLOGY REPORT*  Clinical Data: Code stroke.  Dizziness and syncope  PORTABLE CHEST - 1 VIEW  Comparison: None.  Findings: Heart size is normal.  No pleural effusion or edema.  No airspace consolidation identified.  IMPRESSION:  1.  No acute cardiopulmonary abnormalities.  Original Report Authenticated By: Rosealee Albee, M.D.       ADMISSION DATA: H&P:  History of Present Illness: Patient is a 61 year old male with a past medical history of hypertension, diabetes, alcohol and tobacco use, woke up this  morning and normal state of health, later that morning he developed blurry vision, patient proceeded to go to the store, when he returned home he went to the mailbox, when he had a witnessed syncopal episode and some shaking motion noted by his wife. Patient's wife called EMS which transported the patient to [facility] emergency room. Patient reported that he regained consciousness during the ambulance ride, denied any confusion, palpitation, shortness of breath, chest pain, diplopia or headache. Underwent head CT that showed an old area of ischemia in the right occipital lobe, no sign of acute bleed, no masses noted. The patient had returned to his baseline function no TPA was considered by neurology, but recommended admission for further workup of possible transient ischemic attack.  Physical Exam: Blood pressure 149/80, pulse 90, temperature 98.6 F (37 C), temperature source Oral, resp. rate 20, SpO2 100.00%. General:  alert, well-developed, and cooperative to examination.   Head:  normocephalic and atraumatic.   Eyes:  vision grossly intact, pupils equal, pupils round, pupils reactive to light, no injection and anicteric.   Mouth:  pharynx pink and moist, no erythema, and no exudates.   Neck:  supple, full ROM, no thyromegaly, no JVD, and no carotid bruits.   Lungs:  normal respiratory effort, no accessory muscle use, normal breath sounds, no crackles, and no wheezes. Heart:  normal rate, regular rhythm, no murmur, no gallop, and no rub.   Abdomen:  soft, non-tender, normal bowel sounds, no distention, no guarding, no rebound tenderness, no hepatomegaly, and no splenomegaly.   Msk:  no joint swelling, no joint warmth, and no redness over joints.   Pulses:  2+ DP/PT pulses bilaterally Extremities:  No cyanosis, clubbing, edema Neurologic:  alert & oriented X3, cranial nerves II-XII intact, strength normal in all extremities, sensation intact to light touch, and gait normal.   Skin:  turgor  normal and no rashes.   Psych:  Oriented X3, memory intact for recent and remote, normally interactive, good eye contact, not anxious appearing, and not depressed appearing.  Lab results: Basic Metabolic Panel:  Basename  03/25/12 0913  03/25/12 0853   NA  136  133*   K  4.1  4.1   CL  102  96   CO2  --  25   GLUCOSE  124*  116*   BUN  8  8   CREATININE  0.80  0.82   CALCIUM  --  9.8   MG  --  --   PHOS  --  --    Liver Function Tests:  [facility]  03/25/12 0853   AST  18   ALT  9   ALKPHOS  78   BILITOT  0.3   PROT  8.0   ALBUMIN  3.9    CBC:  Basename  03/25/12 0913  03/25/12 0853   WBC  --  6.9   NEUTROABS  --  4.8   HGB  15.0  13.0   HCT  44.0  39.3   MCV  --  76.6*   PLT  --  262    Cardiac Enzymes:  Basename  03/25/12 0853   CKTOTAL  PENDING   CKMB  3.3   CKMBINDEX  --   TROPONINI  <0.30    Coagulation:  Basename  03/25/12 0853   LABPROT  13.1   INR  0.97    HOSPITAL COURSE:  In short: 89 alcoholic man with a first seizure.  No sequela.  Old strokes possible nidus. Only important issue is abstinence.  Doubt feasibility of anti-convulsant rx.  If rest of w/u is negative, please refer his follow-up to his medical home at the [facility].   # Seizure Vs. Syncopy vs. TIA: patient's presentation is more likely to be seizure episode. Patient's MRI showed remote infarct right parietal - occipital lobe putting him at increased risk for seizure. Patient also has alcohol abuse which may be the very important trigger (withdrawal seizure 24-48 hours after last drink is possible). Although patient has multiple risk factors for vascular disease include hypertension, diabetes and regular tobacc, It is less likely that patient had TIA since Carotid Doppler showed no significant extracranial carotid artery stenosis and vertebrals are patent with antegrade flow. MRI-brain and CT-head  did not show new infarction or intracranial hemorrhage. Another differential diagnosis is  syncope (orthostatic). His wife reports he has been dizzy lately when standing up. But patient was not orthostatic hospital. Patient has mildly abnormal 2-D echo which showed normal LV size with mild LV hypertrophy. EF 45-50% with mild global hypokinesis. It is likely due to his alcohol use, and less likely due to ischemia given Troponin negative X 4. Patient was monitored at telemetry bed. He did not have new episode of seizure activity. Since this is the first episode of seizure, we did not put him on anti-seizure medication. He is discharged home at stable condition. He is follow up with his doctor in [facility]. He promised that he will call to make an appointment in 10 days. [facility]  # HTN: We held his antihypertensives at beginning of admission with concerning of possible stroke or TIA. After ruling out TIA and stroke with MRI and CT-head, he is discharged on his home prinzide.   # Tobacco use: smokes 1PPD, place the patient on nicotine patch and obtained social work consult for cessation counseling.   # DM2: Diet-controlled at home, treated with sliding scale insulin in hospital. A1c is 6.3. Continue Diet-controlled at home at discharge.  # Alcohol use: treated with CIWA in hospital. No withdraw symptoms.  Did counseling for stopping drinking.   DISCHARGE DATA: Vital Signs: BP 112/51  Pulse 87  Temp(Src) 97.9 F (36.6 C) (Oral)  Resp 20  SpO2 99%  Labs: No results found for this or any previous visit (from the past 24 hour(s)).   Time Spent on Discharge:   Signed: Lorretta Harp, MD PGY I, Internal Medicine Resident 03/28/2012, 6:22 PM

## 2012-03-27 NOTE — Care Management Note (Signed)
    Page 1 of 1   03/27/2012     8:45:25 AM   CARE MANAGEMENT NOTE 03/27/2012  Patient:  Ruben Lloyd, Ruben Lloyd   Account Number:  [id number]  Date Initiated:  03/26/2012  Documentation initiated by:  [facility]  Subjective/Objective Assessment:   Admitted with TIA.     Action/Plan:   Anticipated DC Date:  03/26/2012   Anticipated DC Plan:  HOME/SELF CARE      DC Planning Services  CM consult      Choice offered to / List presented to:             Status of service:  Completed, signed off Medicare Important Message given?   (If response is "NO", the following Medicare IM given date fields will be blank) Date Medicare IM given:   Date Additional Medicare IM given:    Discharge Disposition:  HOME/SELF CARE  Per UR Regulation:  Reviewed for med. necessity/level of care/duration of stay  If discussed at Long Length of Stay Meetings, dates discussed:    Comments:

## 2012-03-28 DIAGNOSIS — R569 Unspecified convulsions: Secondary | ICD-10-CM

## 2012-03-31 ENCOUNTER — Encounter: Payer: Self-pay | Admitting: Internal Medicine

## 2012-03-31 NOTE — Progress Notes (Signed)
Progress note about patient 2D echo abnormal results   I wrote a letter to patient to let him know the abnormal 2-D echo results.   2-D Echo study done on 03/26/12 showed following findings:  Normal LV size with mild LV hypertrophy. EF 45-50% with mild global hypokinesis. Normal RV size with mildly decreased systolic function. No significant valvular abnormalities.  I strongly suggested patient to bring this results to his doctor in [place] [facility] and discuss with him for next plan.    Lorretta Harp, MD PGY1, Internal Medicine Teaching Service Pager: [phone]

## 2012-04-02 ENCOUNTER — Telehealth: Payer: Self-pay | Admitting: Internal Medicine

## 2012-04-02 NOTE — Telephone Encounter (Signed)
    I called patient today. He has been doing very well after discharged. I told him the 2D-echo results. I also told him that I sent him a letter about his 2D echo findings. He told me that he already made an appointment on 04/10/12 with his PCP in [place] [facility] in [place]. I suggested him to tell his PCP about the 2D echo findings and also bring my letter to his PCP. He promised that he will do so.  Lorretta Harp, MD PGY1, Internal Medicine Teaching Service Pager: [phone]

## 2012-05-23 ENCOUNTER — Emergency Department (HOSPITAL_COMMUNITY)
Admission: EM | Admit: 2012-05-23 | Discharge: 2012-05-23 | Disposition: A | Discharge status: Home / Self Care | Payer: Non-veteran care | Attending: Emergency Medicine | Admitting: Emergency Medicine

## 2012-05-23 ENCOUNTER — Emergency Department (HOSPITAL_COMMUNITY): Payer: Non-veteran care

## 2012-05-23 ENCOUNTER — Encounter (HOSPITAL_COMMUNITY): Payer: Self-pay | Admitting: *Deleted

## 2012-05-23 DIAGNOSIS — Z8673 Personal history of transient ischemic attack (TIA), and cerebral infarction without residual deficits: Secondary | ICD-10-CM | POA: Insufficient documentation

## 2012-05-23 DIAGNOSIS — R569 Unspecified convulsions: Secondary | ICD-10-CM | POA: Insufficient documentation

## 2012-05-23 DIAGNOSIS — I10 Essential (primary) hypertension: Secondary | ICD-10-CM | POA: Insufficient documentation

## 2012-05-23 DIAGNOSIS — Z7982 Long term (current) use of aspirin: Secondary | ICD-10-CM | POA: Insufficient documentation

## 2012-05-23 DIAGNOSIS — G319 Degenerative disease of nervous system, unspecified: Secondary | ICD-10-CM | POA: Insufficient documentation

## 2012-05-23 DIAGNOSIS — E119 Type 2 diabetes mellitus without complications: Secondary | ICD-10-CM | POA: Insufficient documentation

## 2012-05-23 DIAGNOSIS — F17200 Nicotine dependence, unspecified, uncomplicated: Secondary | ICD-10-CM | POA: Insufficient documentation

## 2012-05-23 HISTORY — DX: Unspecified convulsions: R56.9

## 2012-05-23 LAB — COMPREHENSIVE METABOLIC PANEL
CO2: 24 mEq/L (ref 19–32)
Calcium: 10 mg/dL (ref 8.4–10.5)
Chloride: 97 mEq/L (ref 96–112)
Creatinine, Ser: 0.93 mg/dL (ref 0.50–1.35)
GFR calc Af Amer: >90 mL/min (ref >90)
GFR calc non Af Amer: 88 mL/min — ABNORMAL LOW (ref >90)
Glucose, Bld: 114 mg/dL — ABNORMAL HIGH (ref 70–99)
Total Bilirubin: 0.1 mg/dL — ABNORMAL LOW (ref 0.3–1.2)

## 2012-05-23 LAB — CBC WITH DIFFERENTIAL/PLATELET
Eosinophils Relative: 1 % (ref 0–5)
HCT: 37.4 % — ABNORMAL LOW (ref 39.0–52.0)
Hemoglobin: 12.3 g/dL — ABNORMAL LOW (ref 13.0–17.0)
Lymphocytes Relative: 10 % — ABNORMAL LOW (ref 12–46)
Lymphs Abs: 0.8 10*3/uL (ref 0.7–4.0)
MCV: 76.3 fL — ABNORMAL LOW (ref 78.0–100.0)
Monocytes Absolute: 0.7 10*3/uL (ref 0.1–1.0)
Monocytes Relative: 9 % (ref 3–12)
Neutro Abs: 6 10*3/uL (ref 1.7–7.7)
RBC: 4.9 MIL/uL (ref 4.22–5.81)
WBC: 7.5 10*3/uL (ref 4.0–10.5)

## 2012-05-23 MED ORDER — LEVETIRACETAM 500 MG PO TABS
ORAL_TABLET | ORAL | Status: AC
Start: 2012-05-23 — End: 2012-05-23
  Filled 2012-05-23: qty 1

## 2012-05-23 MED ORDER — SODIUM CHLORIDE 0.9 % IV BOLUS (SEPSIS)
1000.0000 mL | Freq: Once | INTRAVENOUS | Status: AC
  Administered 2012-05-23: 1000 mL via INTRAVENOUS

## 2012-05-23 MED ORDER — LEVETIRACETAM 250 MG PO TABS: 250.0000 mg | ORAL_TABLET | Freq: Two times a day (BID) | ORAL | Status: DC

## 2012-05-23 MED ORDER — LORAZEPAM 2 MG/ML IJ SOLN
1.0000 mg | Freq: Once | INTRAMUSCULAR | Status: AC
  Administered 2012-05-23: 1 mg via INTRAVENOUS
  Filled 2012-05-23: qty 1

## 2012-05-23 MED ORDER — LEVETIRACETAM 250 MG PO TABS
250.0000 mg | ORAL_TABLET | Freq: Once | ORAL | Status: AC
  Administered 2012-05-23: 250 mg via ORAL
  Filled 2012-05-23: qty 1

## 2012-05-23 NOTE — ED Provider Notes (Addendum)
History  This chart was scribed for Ruben Octave, MD by Ladona Ridgel Day. This patient was seen in room APA06/APA06 and the patient's care was started at 1423.  CSN: [id number]  Arrival date & time 05/23/12  1415   First MD Initiated Contact with Patient 05/23/12 1423      Chief Complaint  Patient presents with  . Seizures     The history is provided by the patient. No language interpreter was used.     Ruben Lloyd is a 61 y.o. male with a h/o seizures who presents to the Emergency Department complaining of seizure activity while he was painting his windows today. He states felt sudden onset of dizziness and "flashing lights". He is unsure of LOC but doesn't remember the episode. He reports urinary incontinence. He reports that he feels back to baseline. He was seen at [facility] about 2 months ago for a seizure but has not followed up with a neurologist and is not currently on seizure medications. He denies HA, neck pain, chest pain, weakness, abdominal pain, fevers and rash as associated symptoms. He also has a h/o HTN, DM and CVA. He is a current everyday smoker and occasional alcohol user.  Seen at the [place].  Past Medical History  Diagnosis Date  . Hypertension   . Diabetes mellitus   . Stroke   . Seizures     History reviewed. No pertinent past surgical history.  No family history on file.  History  Substance Use Topics  . Smoking status: Current Everyday Smoker  . Smokeless tobacco: Not on file  . Alcohol Use: Yes     daily      Review of Systems  A complete 10 system review of systems was obtained and all systems are negative except as noted in the HPI and PMH.   Allergies  Review of patient's allergies indicates no known allergies.  Home Medications   Current Outpatient Rx  Name Route Sig Dispense Refill  . IPRATROPIUM-ALBUTEROL 18-103 MCG/ACT IN AERO Inhalation Inhale 2 puffs into the lungs 2 (two) times daily as needed. For shortness of breath    . ASPIRIN 325  MG PO TABS Oral Take 1 tablet (325 mg total) by mouth daily. 30 tablet 11  . ATORVASTATIN CALCIUM 10 MG PO TABS Oral Take 1 tablet (10 mg total) by mouth daily at 6 PM. 30 tablet 5  . IBUPROFEN 800 MG PO TABS Oral Take 800 mg by mouth 2 (two) times daily as needed.    Marland Kitchen LISINOPRIL-HYDROCHLOROTHIAZIDE 10-12.5 MG PO TABS Oral Take 1 tablet by mouth daily.    . OXYBUTYNIN CHLORIDE 5 MG PO TABS Oral Take 5 mg by mouth 2 (two) times daily.      Triage Vitals: BP 135/66  Pulse 111  Temp 98.8 F (37.1 C) (Oral)  Resp 18  SpO2 95%  Physical Exam  Nursing note and vitals reviewed. Constitutional: He is oriented to person, place, and time. He appears well-developed and well-nourished. No distress.  HENT:  Head: Normocephalic.       Abrasion to left lower lip with dried blood on face     Eyes: EOM are normal.       No nystagmus  Neck: Neck supple. No tracheal deviation present.  Cardiovascular: Normal rate, regular rhythm and normal heart sounds.   Pulmonary/Chest: Effort normal and breath sounds normal. No respiratory distress.  Abdominal: Soft. There is no tenderness.  Musculoskeletal: Normal range of motion. He exhibits no edema.  Neurological:  He is alert and oriented to person, place, and time.       Cranial nerves intact Motor strength in all four extremities 5/5 No ataxia Normal finger to nose  Skin: Skin is warm and dry.  Psychiatric: He has a normal mood and affect. His behavior is normal.     ED Course  Procedures (including critical care time)  DIAGNOSTIC STUDIES: Oxygen Saturation is 95% on room air, adequate by my interpretation.    COORDINATION OF CARE: 2:45PM-Discussed treatment plan with pt and pt agreed to plan.   Labs Reviewed  CBC WITH DIFFERENTIAL - Abnormal; Notable for the following:    Hemoglobin 12.3 (*)     HCT 37.4 (*)     MCV 76.3 (*)     MCH 25.1 (*)     Neutrophils Relative 80 (*)     Lymphocytes Relative 10 (*)     All other components  within normal limits  COMPREHENSIVE METABOLIC PANEL - Abnormal; Notable for the following:    Sodium 134 (*)     Glucose, Bld 114 (*)     Total Bilirubin 0.1 (*)     GFR calc non Af Amer 88 (*)     All other components within normal limits  ETHANOL  URINALYSIS, ROUTINE W REFLEX MICROSCOPIC  URINE RAPID DRUG SCREEN (HOSP PERFORMED)   Ct Head Wo Contrast  05/23/2012  *RADIOLOGY REPORT*  Clinical Data: Seizure today.  CT HEAD WITHOUT CONTRAST  Technique:  Contiguous axial images were obtained from the base of the skull through the vertex without contrast.  Comparison: Brain MRI of 03/25/2012.  Head CT 03/25/2012.  Findings: Areas of encephalomalacia in the right parieto-occipital region again noted.  No acute intracranial abnormality. Specifically, no definite evidence of acute/subacute cerebral ischemia, no acute intraparenchymal hemorrhage, and no focal mass, mass effect, hydrocephalus or abnormal intra or extra-axial fluid collections.  Visualized paranasal sinuses and mastoids are well pneumatized.  No acute displaced skull fractures are identified.  IMPRESSION: 1. No acute intracranial abnormalities. 2.  Small areas of encephalomalacia in the right parieto-occipital region again noted.  Original Report Authenticated By: Florencia Reasons, M.D.   Mr Brain Wo Contrast  05/23/2012  *RADIOLOGY REPORT*  Clinical Data: Seizure.  Rule out CVA.  MRI HEAD WITHOUT CONTRAST  Technique:  Multiplanar, multiecho pulse sequences of the brain and surrounding structures were obtained according to standard protocol without intravenous contrast.  Comparison: None.  Findings: A punctate area of diffusion abnormality the left mid brain corresponds with focal anisotropy associated with the left cortical spinal tracts.  There is no definite restricted diffusion on the ADC map.  No other acute infarct is present.  Atrophy and extensive white matter disease is otherwise stable. Remote infarct is present in the right parietal  lobe.  This could certainly serve as a seizure focus.  Remote lacunar infarcts of the basal ganglia are also stable.  Flow is present in the major intracranial arteries.  The globes and orbits are intact.  The paranasal sinuses and mastoid air cells are clear.  IMPRESSION:  1.  Punctate diffusion signal in the left mid brain is felt be artifactual. 2.  Stable atrophy white matter disease. 3.  Stable remote encephalomalacia of the right occipital lobe. This could be a seizure focus. 4.  Stable remote lacunar infarcts of the basal ganglia.  Original Report Authenticated By: Jamesetta Orleans. MATTERN, M.D.     No diagnosis found.    MDM  Possible seizure activity at  home witnessed by son. Positive tongue biting and urinary incontinence. Seizure versus syncope versus TIA episode in May that was attributes to alcohol withdrawal. Patient states he's had no alcohol drinks in the past 3 months. He does not take seizure medication.  CT scan shows stable area of encephalomalacia in the right occipital region. This could be seizure focus. Patient has a nonfocal neuro exam is at his baseline per family. Doubt alcohol withdrawal as patient has not had any alcohol in 3 months.  D/w Dr. Gerilyn Pilgrim.  Agrees could be seizure focus and recommends starting keppra 250 mg BID. [facility] with [facility]. HR improved to 98 on recheck.   Date: 05/23/2012  Rate: 113  Rhythm: sinus tachycardia  QRS Axis: normal  Intervals: QT prolonged  ST/T Wave abnormalities: normal  Conduction Disutrbances:right bundle branch block  Narrative Interpretation: Rate faster  Old EKG Reviewed: unchanged          Ruben Octave, MD 05/23/12 1709  Ruben Octave, MD 05/23/12 1610

## 2012-05-23 NOTE — ED Notes (Signed)
Called AC for Keppra PO

## 2012-05-23 NOTE — ED Notes (Signed)
Brought in via for seizure activity, history of seizures but does not take meds

## 2012-10-06 ENCOUNTER — Encounter (HOSPITAL_COMMUNITY): Payer: Self-pay

## 2012-10-06 ENCOUNTER — Emergency Department (HOSPITAL_COMMUNITY): Payer: Non-veteran care

## 2012-10-06 ENCOUNTER — Emergency Department (HOSPITAL_COMMUNITY)
Admission: EM | Admit: 2012-10-06 | Discharge: 2012-10-06 | Disposition: A | Discharge status: Home / Self Care | Payer: Non-veteran care | Attending: Emergency Medicine | Admitting: Emergency Medicine

## 2012-10-06 DIAGNOSIS — Z7982 Long term (current) use of aspirin: Secondary | ICD-10-CM | POA: Insufficient documentation

## 2012-10-06 DIAGNOSIS — I10 Essential (primary) hypertension: Secondary | ICD-10-CM | POA: Insufficient documentation

## 2012-10-06 DIAGNOSIS — T07XXXA Unspecified multiple injuries, initial encounter: Secondary | ICD-10-CM | POA: Insufficient documentation

## 2012-10-06 DIAGNOSIS — F17200 Nicotine dependence, unspecified, uncomplicated: Secondary | ICD-10-CM | POA: Insufficient documentation

## 2012-10-06 DIAGNOSIS — W19XXXA Unspecified fall, initial encounter: Secondary | ICD-10-CM | POA: Insufficient documentation

## 2012-10-06 DIAGNOSIS — Z79899 Other long term (current) drug therapy: Secondary | ICD-10-CM | POA: Insufficient documentation

## 2012-10-06 DIAGNOSIS — Y939 Activity, unspecified: Secondary | ICD-10-CM | POA: Insufficient documentation

## 2012-10-06 DIAGNOSIS — Z9889 Other specified postprocedural states: Secondary | ICD-10-CM | POA: Insufficient documentation

## 2012-10-06 DIAGNOSIS — Z8673 Personal history of transient ischemic attack (TIA), and cerebral infarction without residual deficits: Secondary | ICD-10-CM | POA: Insufficient documentation

## 2012-10-06 DIAGNOSIS — Y9289 Other specified places as the place of occurrence of the external cause: Secondary | ICD-10-CM | POA: Insufficient documentation

## 2012-10-06 DIAGNOSIS — G40909 Epilepsy, unspecified, not intractable, without status epilepticus: Secondary | ICD-10-CM | POA: Insufficient documentation

## 2012-10-06 DIAGNOSIS — E119 Type 2 diabetes mellitus without complications: Secondary | ICD-10-CM | POA: Insufficient documentation

## 2012-10-06 DIAGNOSIS — M549 Dorsalgia, unspecified: Secondary | ICD-10-CM | POA: Insufficient documentation

## 2012-10-06 MED ORDER — HYDROCODONE-ACETAMINOPHEN 5-325 MG PO TABS: 2.0000 | ORAL_TABLET | Freq: Four times a day (QID) | ORAL | Status: DC | PRN

## 2012-10-06 MED ORDER — CYCLOBENZAPRINE HCL 10 MG PO TABS: 10.0000 mg | ORAL_TABLET | Freq: Three times a day (TID) | ORAL | Status: DC | PRN

## 2012-10-06 NOTE — ED Notes (Signed)
MD at bedside. 

## 2012-10-06 NOTE — ED Notes (Signed)
Patient with no complaints at this time. Respirations even and unlabored. Skin warm/dry. Discharge instructions reviewed with patient at this time. Patient given opportunity to voice concerns/ask questions. Patient discharged at this time and left Emergency Department via wheelchair. 

## 2012-10-06 NOTE — ED Provider Notes (Signed)
History   This chart was scribed for [name], MD by [name], ER Scribe. The patient was seen in room APA02/APA02. Patient's care was started at 10:45 AM.   CSN: [id number]  Arrival date & time 10/06/12  0946   First MD Initiated Contact with Patient 10/06/12 1045      Chief Complaint  Patient presents with  . Fall    The history is provided by the patient. No language interpreter was used.    Ruben Lloyd is a 61 y.o. male who presents to the Emergency Department complaining of intermittent, moderate pain in the left foot near toes and left posterior ribs after a fall in the bathroom last 3 days ago. Pt had a "small" seizure last Friday for which he denies LOC. He states that he was able to walk to his house and take his seizure medication soon after his seizure. Pt reports that pain is worse with a cough and palpation. Pt denies any fever or regular coughing. He reports he takes Keppra for his seizure disorder. Patient is vague about the mechanism of injury.  [facility]   Past Medical History  Diagnosis Date  . Hypertension   . Diabetes mellitus   . Stroke   . Seizures     Past Surgical History  Procedure Date  . Cholecystectomy   . Back surgery     No family history on file.  History  Substance Use Topics  . Smoking status: Current Every Day Smoker  . Smokeless tobacco: Not on file  . Alcohol Use: No   Pt is a current smoker and recently quit drinking. He also reports former marijuana and cocaine use.  Lives with wife.   Review of Systems  Constitutional: Negative for fever.  Respiratory: Negative for cough.   Musculoskeletal: Positive for back pain and arthralgias.  All other systems reviewed and are negative.    Allergies  Review of patient's allergies indicates no known allergies.  Home Medications   Current Outpatient Rx  Name  Route  Sig  Dispense  Refill  . ALBUTEROL SULFATE HFA 108 (90 BASE) MCG/ACT  IN AERS   Inhalation   Inhale 2 puffs into the lungs every 6 (six) hours as needed.         . ASPIRIN 325 MG PO TABS   Oral   Take 1 tablet (325 mg total) by mouth daily.   30 tablet   11   . DOCUSATE SODIUM 100 MG PO CAPS   Oral   Take 100 mg by mouth daily as needed. Constipation         . GABAPENTIN 300 MG PO CAPS   Oral   Take 300 mg by mouth daily as needed.         Marland Kitchen HYDROCHLOROTHIAZIDE 25 MG PO TABS   Oral   Take 12.5 mg by mouth daily. For Blood Pressure         . IBUPROFEN 800 MG PO TABS   Oral   Take 800 mg by mouth 2 (two) times daily as needed. Pain         . LEVETIRACETAM 500 MG PO TABS   Oral   Take 500 mg by mouth 2 (two) times daily.         Marland Kitchen LISINOPRIL 20 MG PO TABS   Oral   Take 10 mg by mouth daily.          Marland Kitchen  MAGNESIUM CITRATE PO SOLN   Oral   Take 0.5 Bottles by mouth once as needed. Constipation         . OMEPRAZOLE 20 MG PO CPDR   Oral   Take 20 mg by mouth daily. To control stomach acid         . SILDENAFIL CITRATE 100 MG PO TABS   Oral   Take 100 mg by mouth daily as needed. For activity           BP 118/81  Pulse 111  Temp 97.8 F (36.6 C) (Oral)  Resp 20  Ht 6\' 6"  (1.981 m)  Wt 225 lb (102.059 kg)  BMI 26.00 kg/m2  SpO2 97%  Vital signs normal except tachycardia   Physical Exam  Nursing note and vitals reviewed. Constitutional: He is oriented to person, place, and time. He appears well-developed and well-nourished. No distress.  HENT:  Head: Normocephalic and atraumatic.  Eyes: EOM are normal. Pupils are equal, round, and reactive to light.  Neck: Normal range of motion. Neck supple. No tracheal deviation present.  Cardiovascular: Normal rate and normal heart sounds.   Pulmonary/Chest: Effort normal and breath sounds normal. No respiratory distress. He has no wheezes. He exhibits tenderness.       Tender along the lower rib margin in left posterior chest without bruising, crepitance or bruising.     Abdominal: Soft. Bowel sounds are normal. He exhibits no distension. There is no tenderness.  Musculoskeletal: Normal range of motion. He exhibits tenderness. He exhibits no edema.       Tenderness over lower edge of posterior ribcage that reproduces the pain. Tenderness over left ankle but no swelling or bruising. Tenderness in the toes that is inconsistent. Leg, feet, toes are not cold bilaterally. Normal color. Excellent ROM of left leg. No bruising or swelling noted.   Neurological: He is alert and oriented to person, place, and time.  Skin: Skin is warm and dry.       Scattered hyperpigmentation over the posterior chest, which is non-tender.       Psychiatric: He has a normal mood and affect. His behavior is normal.    ED Course  Procedures (including critical care time)  DIAGNOSTIC STUDIES: Oxygen Saturation is 97% on room air, normal by my interpretation.    COORDINATION OF CARE:  11:41 AM Discussed planned course of treatment with the patient, who is agreeable at this time.     Dg Ribs Unilateral W/chest Left  10/06/2012  *RADIOLOGY REPORT*  Clinical Data: Fall 1 week ago with low posterior rib pain.  LEFT RIBS AND CHEST - 3+ VIEW  Comparison: 03/25/2012.  Findings: Frontal view of the chest shows midline trachea and normal heart size.  Lungs appear hyperinflated but clear.  No pleural fluid.  Dedicated views of the left ribs show no definite fracture.  On series 4004, lucency in the lateral left eleventh rib is felt to be due to overlapping bowel.  IMPRESSION: No acute findings.   Original Report Authenticated By: Leanna Battles, M.D.    Dg Foot Complete Left  10/06/2012  *RADIOLOGY REPORT*  Clinical Data: Fall with foot pain.  LEFT FOOT - COMPLETE 3+ VIEW  Comparison: None.  Findings: Mild first metatarsal phalangeal joint osteoarthritis. Fractures of the fourth and fifth proximal phalanges appear remote in age.  No evidence of acute fracture.  IMPRESSION:  1.  No evidence  of an acute fracture. 2.  Fractures of the fourth and fifth proximal phalanges appear  remote in age.   Original Report Authenticated By: Leanna Battles, M.D.      1. Multiple contusions    New Prescriptions   CYCLOBENZAPRINE (FLEXERIL) 10 MG TABLET    Take 1 tablet (10 mg total) by mouth 3 (three) times daily as needed for muscle spasms.   HYDROCODONE-ACETAMINOPHEN (NORCO/VICODIN) 5-325 MG PER TABLET    Take 2 tablets by mouth every 6 (six) hours as needed for pain.   Plan discharge  Devoria Albe, MD, FACEP    MDM   I personally performed the services described in this documentation, which was scribed in my presence. The recorded information has been reviewed and considered.  Devoria Albe, MD, Armando Gang    [name], MD 10/06/12 [phone]

## 2012-10-06 NOTE — ED Notes (Signed)
Pt reports last week had a seizure and fell last week.   C/O pain in left foot and left ribs.

## 2012-11-08 ENCOUNTER — Emergency Department (HOSPITAL_COMMUNITY): Payer: Non-veteran care

## 2012-11-08 ENCOUNTER — Encounter (HOSPITAL_COMMUNITY): Payer: Self-pay | Admitting: *Deleted

## 2012-11-08 ENCOUNTER — Emergency Department (HOSPITAL_COMMUNITY)
Admission: EM | Admit: 2012-11-08 | Discharge: 2012-11-08 | Disposition: A | Discharge status: Home / Self Care | Payer: Non-veteran care | Attending: Emergency Medicine | Admitting: Emergency Medicine

## 2012-11-08 DIAGNOSIS — Z8673 Personal history of transient ischemic attack (TIA), and cerebral infarction without residual deficits: Secondary | ICD-10-CM | POA: Insufficient documentation

## 2012-11-08 DIAGNOSIS — M2550 Pain in unspecified joint: Secondary | ICD-10-CM | POA: Insufficient documentation

## 2012-11-08 DIAGNOSIS — Z7982 Long term (current) use of aspirin: Secondary | ICD-10-CM | POA: Insufficient documentation

## 2012-11-08 DIAGNOSIS — F17200 Nicotine dependence, unspecified, uncomplicated: Secondary | ICD-10-CM | POA: Insufficient documentation

## 2012-11-08 DIAGNOSIS — I10 Essential (primary) hypertension: Secondary | ICD-10-CM | POA: Insufficient documentation

## 2012-11-08 DIAGNOSIS — E119 Type 2 diabetes mellitus without complications: Secondary | ICD-10-CM | POA: Insufficient documentation

## 2012-11-08 DIAGNOSIS — M79673 Pain in unspecified foot: Secondary | ICD-10-CM

## 2012-11-08 DIAGNOSIS — M79609 Pain in unspecified limb: Secondary | ICD-10-CM | POA: Insufficient documentation

## 2012-11-08 DIAGNOSIS — G40909 Epilepsy, unspecified, not intractable, without status epilepticus: Secondary | ICD-10-CM | POA: Insufficient documentation

## 2012-11-08 DIAGNOSIS — Z79899 Other long term (current) drug therapy: Secondary | ICD-10-CM | POA: Insufficient documentation

## 2012-11-08 LAB — BASIC METABOLIC PANEL
BUN: 11 mg/dL (ref 6–23)
Calcium: 9.7 mg/dL (ref 8.4–10.5)
GFR calc Af Amer: >90 mL/min (ref >90)
GFR calc non Af Amer: >90 mL/min (ref >90)
Glucose, Bld: 110 mg/dL — ABNORMAL HIGH (ref 70–99)
Potassium: 4 mEq/L (ref 3.5–5.1)
Sodium: 134 mEq/L — ABNORMAL LOW (ref 135–145)

## 2012-11-08 MED ORDER — HYDROCODONE-ACETAMINOPHEN 5-325 MG PO TABS
2.0000 | ORAL_TABLET | Freq: Once | ORAL | Status: AC
  Administered 2012-11-08: 2 via ORAL
  Filled 2012-11-08: qty 2

## 2012-11-08 MED ORDER — PROMETHAZINE HCL 12.5 MG PO TABS
12.5000 mg | ORAL_TABLET | Freq: Once | ORAL | Status: AC
  Administered 2012-11-08: 12.5 mg via ORAL
  Filled 2012-11-08: qty 1

## 2012-11-08 MED ORDER — HYDROCODONE-ACETAMINOPHEN 5-325 MG PO TABS: ORAL_TABLET | ORAL | Status: DC

## 2012-11-08 NOTE — ED Provider Notes (Signed)
History     CSN: [id number]  Arrival date & time 11/08/12  4098   First MD Initiated Contact with Patient 11/08/12 [phone]      Chief Complaint  Patient presents with  . Foot Pain    (Consider location/radiation/quality/duration/timing/severity/associated sxs/prior treatment) Patient is a 61 y.o. male presenting with lower extremity pain. The history is provided by the patient.  Foot Pain This is a new problem. The current episode started 1 to 4 weeks ago. The problem occurs constantly. Associated symptoms include arthralgias. Pertinent negatives include no abdominal pain, chest pain, coughing, neck pain or numbness. The symptoms are aggravated by standing and walking. He has tried nothing for the symptoms. The treatment provided no relief.    Past Medical History  Diagnosis Date  . Hypertension   . Diabetes mellitus   . Stroke   . Seizures     Past Surgical History  Procedure Date  . Cholecystectomy   . Neck surgery     No family history on file.  History  Substance Use Topics  . Smoking status: Current Every Day Smoker    Types: Cigarettes  . Smokeless tobacco: Not on file  . Alcohol Use: No      Review of Systems  Constitutional: Negative for activity change.       All ROS Neg except as noted in HPI  HENT: Negative for nosebleeds and neck pain.   Eyes: Negative for photophobia and discharge.  Respiratory: Negative for cough, shortness of breath and wheezing.   Cardiovascular: Negative for chest pain and palpitations.  Gastrointestinal: Negative for abdominal pain and blood in stool.  Genitourinary: Negative for dysuria, frequency and hematuria.  Musculoskeletal: Positive for arthralgias. Negative for back pain.  Skin: Negative.   Neurological: Negative for dizziness, seizures, speech difficulty and numbness.  Psychiatric/Behavioral: Negative for hallucinations and confusion.    Allergies  Review of patient's allergies indicates no known  allergies.  Home Medications   Current Outpatient Rx  Name  Route  Sig  Dispense  Refill  . ALBUTEROL SULFATE HFA 108 (90 BASE) MCG/ACT IN AERS   Inhalation   Inhale 2 puffs into the lungs every 6 (six) hours as needed.         . ASPIRIN 325 MG PO TABS   Oral   Take 1 tablet (325 mg total) by mouth daily.   30 tablet   11   . CYCLOBENZAPRINE HCL 10 MG PO TABS   Oral   Take 1 tablet (10 mg total) by mouth 3 (three) times daily as needed for muscle spasms.   30 tablet   0   . DOCUSATE SODIUM 100 MG PO CAPS   Oral   Take 100 mg by mouth daily as needed. Constipation         . GABAPENTIN 300 MG PO CAPS   Oral   Take 300 mg by mouth daily as needed.         Marland Kitchen HYDROCHLOROTHIAZIDE 25 MG PO TABS   Oral   Take 12.5 mg by mouth daily. For Blood Pressure         . HYDROCODONE-ACETAMINOPHEN 5-325 MG PO TABS   Oral   Take 2 tablets by mouth every 6 (six) hours as needed for pain.   10 tablet   0   . IBUPROFEN 800 MG PO TABS   Oral   Take 800 mg by mouth 2 (two) times daily as needed. Pain         .  LEVETIRACETAM 500 MG PO TABS   Oral   Take 500 mg by mouth 2 (two) times daily.         Marland Kitchen LISINOPRIL 20 MG PO TABS   Oral   Take 10 mg by mouth daily.          Marland Kitchen MAGNESIUM CITRATE PO SOLN   Oral   Take 0.5 Bottles by mouth once as needed. Constipation         . OMEPRAZOLE 20 MG PO CPDR   Oral   Take 20 mg by mouth daily. To control stomach acid         . SILDENAFIL CITRATE 100 MG PO TABS   Oral   Take 100 mg by mouth daily as needed. For activity           BP 136/74  Pulse 116  Temp 97.8 F (36.6 C) (Oral)  Resp 20  Ht 6\' 4"  (1.93 m)  Wt 235 lb (106.595 kg)  BMI 28.61 kg/m2  SpO2 96%  Physical Exam  Nursing note and vitals reviewed. Constitutional: He is oriented to person, place, and time. He appears well-developed and well-nourished.  Non-toxic appearance.  HENT:  Head: Normocephalic.  Right Ear: Tympanic membrane and external ear  normal.  Left Ear: Tympanic membrane and external ear normal.  Eyes: EOM and lids are normal. Pupils are equal, round, and reactive to light.  Neck: Normal range of motion. Neck supple. Carotid bruit is not present.  Cardiovascular: Normal rate, regular rhythm, normal heart sounds, intact distal pulses and normal pulses.   Pulmonary/Chest: Breath sounds normal. No respiratory distress.  Abdominal: Soft. Bowel sounds are normal. There is no tenderness. There is no guarding.  Musculoskeletal: Normal range of motion.       Pain with movement or palpation  of the left first toe. No lesions between the toes. DP 1+. Cap less than 3 sec. No noted puncture wounds. No red streaking.   Lymphadenopathy:       Head (right side): No submandibular adenopathy present.       Head (left side): No submandibular adenopathy present.    He has no cervical adenopathy.  Neurological: He is alert and oriented to person, place, and time. He has normal strength. No cranial nerve deficit or sensory deficit.  Skin: Skin is warm and dry.  Psychiatric: He has a normal mood and affect. His speech is normal.    ED Course  Procedures (including critical care time)  Labs Reviewed - No data to display No results found. Pulse ox 96% on room air. WNL by my interpretation.  No diagnosis found.    MDM  I have reviewed nursing notes, vital signs, and all appropriate lab and imaging results for this patient. Pt is a type 2 diabetic with hx of foot pain for over 2 weeks. Last night the pain kept him up most  Of the night. He is scheduled to see his doctors at the [facility] hospital on Monday 12/23. Rx for norco given to the patient. Pt strongly encouraged to see MD at the [facility] as scheduled.       Kathie Dike, [place] 11/11/12 [phone]

## 2012-11-08 NOTE — ED Notes (Signed)
Foot injury x 2 months.  Seen and had negative x-ray.  Reports pain and swelling to left foot is increasing.

## 2012-11-12 NOTE — ED Provider Notes (Signed)
Medical screening examination/treatment/procedure(s) were performed by non-physician practitioner and as supervising physician I was immediately available for consultation/collaboration.   Dione Booze, MD 11/12/12 [phone]

## 2013-04-29 ENCOUNTER — Inpatient Hospital Stay (HOSPITAL_COMMUNITY)
Admission: EM | Admit: 2013-04-29 | Discharge: 2013-05-02 | DRG: 863 | Disposition: A | Discharge status: Other Acute Inpatient Hospital | Payer: Non-veteran care | Attending: Internal Medicine | Admitting: Internal Medicine

## 2013-04-29 ENCOUNTER — Inpatient Hospital Stay (HOSPITAL_COMMUNITY): Payer: Non-veteran care

## 2013-04-29 ENCOUNTER — Emergency Department (HOSPITAL_COMMUNITY): Payer: Non-veteran care

## 2013-04-29 ENCOUNTER — Encounter (HOSPITAL_COMMUNITY): Payer: Self-pay | Admitting: Emergency Medicine

## 2013-04-29 DIAGNOSIS — I10 Essential (primary) hypertension: Secondary | ICD-10-CM | POA: Diagnosis present

## 2013-04-29 DIAGNOSIS — F419 Anxiety disorder, unspecified: Secondary | ICD-10-CM

## 2013-04-29 DIAGNOSIS — T8149XA Infection following a procedure, other surgical site, initial encounter: Secondary | ICD-10-CM

## 2013-04-29 DIAGNOSIS — Z720 Tobacco use: Secondary | ICD-10-CM | POA: Diagnosis present

## 2013-04-29 DIAGNOSIS — L0390 Cellulitis, unspecified: Secondary | ICD-10-CM

## 2013-04-29 DIAGNOSIS — K219 Gastro-esophageal reflux disease without esophagitis: Secondary | ICD-10-CM | POA: Diagnosis present

## 2013-04-29 DIAGNOSIS — F411 Generalized anxiety disorder: Secondary | ICD-10-CM | POA: Diagnosis present

## 2013-04-29 DIAGNOSIS — E119 Type 2 diabetes mellitus without complications: Secondary | ICD-10-CM

## 2013-04-29 DIAGNOSIS — M542 Cervicalgia: Secondary | ICD-10-CM | POA: Diagnosis present

## 2013-04-29 DIAGNOSIS — G8929 Other chronic pain: Secondary | ICD-10-CM

## 2013-04-29 DIAGNOSIS — R569 Unspecified convulsions: Secondary | ICD-10-CM | POA: Diagnosis present

## 2013-04-29 DIAGNOSIS — I739 Peripheral vascular disease, unspecified: Secondary | ICD-10-CM | POA: Diagnosis present

## 2013-04-29 DIAGNOSIS — M869 Osteomyelitis, unspecified: Secondary | ICD-10-CM | POA: Diagnosis present

## 2013-04-29 DIAGNOSIS — IMO0001 Reserved for inherently not codable concepts without codable children: Secondary | ICD-10-CM | POA: Diagnosis present

## 2013-04-29 DIAGNOSIS — T8140XA Infection following a procedure, unspecified, initial encounter: Principal | ICD-10-CM | POA: Diagnosis present

## 2013-04-29 DIAGNOSIS — Y838 Other surgical procedures as the cause of abnormal reaction of the patient, or of later complication, without mention of misadventure at the time of the procedure: Secondary | ICD-10-CM | POA: Diagnosis present

## 2013-04-29 DIAGNOSIS — Z7289 Other problems related to lifestyle: Secondary | ICD-10-CM

## 2013-04-29 DIAGNOSIS — S98119A Complete traumatic amputation of unspecified great toe, initial encounter: Secondary | ICD-10-CM

## 2013-04-29 DIAGNOSIS — F17200 Nicotine dependence, unspecified, uncomplicated: Secondary | ICD-10-CM | POA: Diagnosis present

## 2013-04-29 DIAGNOSIS — G40909 Epilepsy, unspecified, not intractable, without status epilepticus: Secondary | ICD-10-CM | POA: Diagnosis present

## 2013-04-29 HISTORY — DX: Other chronic pain: G89.29

## 2013-04-29 HISTORY — DX: Cervicalgia: M54.2

## 2013-04-29 HISTORY — DX: Gastro-esophageal reflux disease without esophagitis: K21.9

## 2013-04-29 HISTORY — DX: Peripheral vascular disease, unspecified: I73.9

## 2013-04-29 HISTORY — DX: Anxiety disorder, unspecified: F41.9

## 2013-04-29 LAB — CBC WITH DIFFERENTIAL/PLATELET
Basophils Absolute: 0 10*3/uL (ref 0.0–0.1)
Basophils Relative: 0 % (ref 0–1)
Eosinophils Absolute: 0.3 10*3/uL (ref 0.0–0.7)
MCH: 24.5 pg — ABNORMAL LOW (ref 26.0–34.0)
MCHC: 31.7 g/dL (ref 30.0–36.0)
Neutro Abs: 4.1 10*3/uL (ref 1.7–7.7)
Neutrophils Relative %: 66 % (ref 43–77)
Platelets: 324 10*3/uL (ref 150–400)

## 2013-04-29 LAB — GLUCOSE, CAPILLARY: Glucose-Capillary: 90 mg/dL (ref 70–99)

## 2013-04-29 LAB — BASIC METABOLIC PANEL
GFR calc Af Amer: >90 mL/min (ref >90)
GFR calc non Af Amer: >90 mL/min (ref >90)
Potassium: 4 mEq/L (ref 3.5–5.1)
Sodium: 139 mEq/L (ref 135–145)

## 2013-04-29 MED ORDER — VANCOMYCIN HCL 10 G IV SOLR
1500.0000 mg | Freq: Once | INTRAVENOUS | Status: AC
  Administered 2013-04-29: 1500 mg via INTRAVENOUS
  Filled 2013-04-29: qty 1500

## 2013-04-29 MED ORDER — ONDANSETRON HCL 4 MG/2ML IJ SOLN: 4.0000 mg | Freq: Four times a day (QID) | INTRAMUSCULAR | Status: DC | PRN

## 2013-04-29 MED ORDER — ALBUTEROL SULFATE HFA 108 (90 BASE) MCG/ACT IN AERS
2.0000 | INHALATION_SPRAY | Freq: Four times a day (QID) | RESPIRATORY_TRACT | Status: DC | PRN
  Administered 2013-04-29 – 2013-04-30 (×3): 2 via RESPIRATORY_TRACT
  Filled 2013-04-29: qty 6.7

## 2013-04-29 MED ORDER — ENOXAPARIN SODIUM 40 MG/0.4ML ~~LOC~~ SOLN
40.0000 mg | SUBCUTANEOUS | Status: DC
  Administered 2013-04-29 – 2013-05-01 (×3): 40 mg via SUBCUTANEOUS
  Filled 2013-04-29 (×3): qty 0.4

## 2013-04-29 MED ORDER — VANCOMYCIN HCL IN DEXTROSE 1-5 GM/200ML-% IV SOLN: 1000.0000 mg | Freq: Three times a day (TID) | INTRAVENOUS | Status: DC

## 2013-04-29 MED ORDER — ALPRAZOLAM 0.5 MG PO TABS
1.0000 mg | ORAL_TABLET | Freq: Two times a day (BID) | ORAL | Status: DC
  Administered 2013-04-29 – 2013-05-02 (×6): 1 mg via ORAL
  Filled 2013-04-29: qty 2
  Filled 2013-04-29: qty 1
  Filled 2013-04-29 (×3): qty 2
  Filled 2013-04-29: qty 1
  Filled 2013-04-29: qty 2

## 2013-04-29 MED ORDER — MORPHINE SULFATE 2 MG/ML IJ SOLN
2.0000 mg | INTRAMUSCULAR | Status: DC | PRN
  Administered 2013-04-29: 2 mg via INTRAVENOUS
  Filled 2013-04-29: qty 1

## 2013-04-29 MED ORDER — CYCLOBENZAPRINE HCL 10 MG PO TABS: 10.0000 mg | ORAL_TABLET | Freq: Three times a day (TID) | ORAL | Status: DC | PRN

## 2013-04-29 MED ORDER — LEVETIRACETAM 500 MG PO TABS: 250.0000 mg | ORAL_TABLET | Freq: Three times a day (TID) | ORAL | Status: DC

## 2013-04-29 MED ORDER — ACETAMINOPHEN 325 MG PO TABS: 650.0000 mg | ORAL_TABLET | Freq: Four times a day (QID) | ORAL | Status: DC | PRN

## 2013-04-29 MED ORDER — SODIUM CHLORIDE 0.9 % IV SOLN
Freq: Once | INTRAVENOUS | Status: AC
  Administered 2013-04-29: 11:00:00 via INTRAVENOUS

## 2013-04-29 MED ORDER — LISINOPRIL 10 MG PO TABS
10.0000 mg | ORAL_TABLET | Freq: Every day | ORAL | Status: DC
  Administered 2013-04-30 – 2013-05-02 (×3): 10 mg via ORAL
  Filled 2013-04-29 (×3): qty 1

## 2013-04-29 MED ORDER — HYDROCODONE-ACETAMINOPHEN 5-325 MG PO TABS
1.0000 | ORAL_TABLET | ORAL | Status: DC | PRN
  Administered 2013-04-29: 1 via ORAL
  Administered 2013-04-29: 2 via ORAL
  Administered 2013-04-30: 1 via ORAL
  Administered 2013-05-01 – 2013-05-02 (×2): 2 via ORAL
  Filled 2013-04-29 (×2): qty 2
  Filled 2013-04-29: qty 1
  Filled 2013-04-29 (×2): qty 2

## 2013-04-29 MED ORDER — ONDANSETRON HCL 4 MG/2ML IJ SOLN
4.0000 mg | Freq: Once | INTRAMUSCULAR | Status: AC
  Administered 2013-04-29: 4 mg via INTRAVENOUS
  Filled 2013-04-29: qty 2

## 2013-04-29 MED ORDER — LEVETIRACETAM 500 MG PO TABS
500.0000 mg | ORAL_TABLET | Freq: Every day | ORAL | Status: DC
  Administered 2013-04-30 – 2013-05-02 (×3): 500 mg via ORAL
  Filled 2013-04-29 (×4): qty 1

## 2013-04-29 MED ORDER — PIPERACILLIN-TAZOBACTAM 3.375 G IVPB
3.3750 g | Freq: Once | INTRAVENOUS | Status: AC
  Administered 2013-04-29: 3.375 g via INTRAVENOUS
  Filled 2013-04-29: qty 50

## 2013-04-29 MED ORDER — MORPHINE SULFATE 4 MG/ML IJ SOLN
4.0000 mg | Freq: Once | INTRAMUSCULAR | Status: AC
  Administered 2013-04-29: 4 mg via INTRAVENOUS
  Filled 2013-04-29: qty 1

## 2013-04-29 MED ORDER — ALUM & MAG HYDROXIDE-SIMETH 200-200-20 MG/5ML PO SUSP: 30.0000 mL | Freq: Four times a day (QID) | ORAL | Status: DC | PRN

## 2013-04-29 MED ORDER — ACETAMINOPHEN 650 MG RE SUPP: 650.0000 mg | Freq: Four times a day (QID) | RECTAL | Status: DC | PRN

## 2013-04-29 MED ORDER — SODIUM CHLORIDE 0.9 % IV SOLN
INTRAVENOUS | Status: AC
  Administered 2013-04-29: 15:00:00 via INTRAVENOUS

## 2013-04-29 MED ORDER — MAGNESIUM CITRATE PO SOLN: 1.0000 | Freq: Once | ORAL | Status: AC | PRN

## 2013-04-29 MED ORDER — PIPERACILLIN-TAZOBACTAM 3.375 G IVPB: 3.3750 g | Freq: Three times a day (TID) | INTRAVENOUS | Status: DC

## 2013-04-29 MED ORDER — GABAPENTIN 300 MG PO CAPS: 600.0000 mg | ORAL_CAPSULE | Freq: Two times a day (BID) | ORAL | Status: DC | PRN

## 2013-04-29 MED ORDER — IBUPROFEN 800 MG PO TABS: 800.0000 mg | ORAL_TABLET | Freq: Two times a day (BID) | ORAL | Status: DC | PRN

## 2013-04-29 MED ORDER — LEVETIRACETAM 500 MG PO TABS
250.0000 mg | ORAL_TABLET | Freq: Two times a day (BID) | ORAL | Status: DC
  Administered 2013-04-29 – 2013-05-02 (×6): 250 mg via ORAL
  Filled 2013-04-29 (×5): qty 1

## 2013-04-29 MED ORDER — ONDANSETRON HCL 4 MG PO TABS: 4.0000 mg | ORAL_TABLET | Freq: Four times a day (QID) | ORAL | Status: DC | PRN

## 2013-04-29 MED ORDER — HYDROCHLOROTHIAZIDE 25 MG PO TABS
12.5000 mg | ORAL_TABLET | Freq: Every day | ORAL | Status: DC
  Administered 2013-04-30 – 2013-05-02 (×3): 12.5 mg via ORAL
  Filled 2013-04-29 (×3): qty 1

## 2013-04-29 MED ORDER — VANCOMYCIN HCL IN DEXTROSE 1-5 GM/200ML-% IV SOLN
1000.0000 mg | Freq: Three times a day (TID) | INTRAVENOUS | Status: DC
  Administered 2013-04-29 – 2013-05-02 (×8): 1000 mg via INTRAVENOUS
  Filled 2013-04-29 (×14): qty 200

## 2013-04-29 MED ORDER — PANTOPRAZOLE SODIUM 40 MG PO TBEC
40.0000 mg | DELAYED_RELEASE_TABLET | Freq: Every day | ORAL | Status: DC
  Administered 2013-04-30 – 2013-05-02 (×3): 40 mg via ORAL
  Filled 2013-04-29 (×3): qty 1

## 2013-04-29 MED ORDER — INSULIN ASPART 100 UNIT/ML ~~LOC~~ SOLN: 0.0000 [IU] | Freq: Three times a day (TID) | SUBCUTANEOUS | Status: DC

## 2013-04-29 MED ORDER — BISACODYL 10 MG RE SUPP: 10.0000 mg | Freq: Every day | RECTAL | Status: DC | PRN

## 2013-04-29 MED ORDER — PIPERACILLIN-TAZOBACTAM 3.375 G IVPB
3.3750 g | Freq: Three times a day (TID) | INTRAVENOUS | Status: DC
  Administered 2013-04-29 – 2013-05-02 (×9): 3.375 g via INTRAVENOUS
  Filled 2013-04-29 (×15): qty 50

## 2013-04-29 MED ORDER — DOCUSATE SODIUM 100 MG PO CAPS
100.0000 mg | ORAL_CAPSULE | Freq: Every day | ORAL | Status: DC
  Administered 2013-04-29 – 2013-04-30 (×2): 100 mg via ORAL
  Filled 2013-04-29 (×2): qty 1

## 2013-04-29 NOTE — ED Provider Notes (Signed)
Medical screening examination/treatment/procedure(s) were performed by non-physician practitioner and as supervising physician I was immediately available for consultation/collaboration.   Kairen Hallinan L Irja Wheless, MD 04/29/13 1426 

## 2013-04-29 NOTE — Progress Notes (Signed)
Pt came up from ED. Pt is in NAD will continue to monitor.

## 2013-04-29 NOTE — Consult Note (Signed)
Reason for Consult: Right groin swelling Referring Physician: Triad Lloyd  Ruben Lloyd is an 62 y.o. male.  HPI: Patient is a 62 year old black male who underwent a fem-fem bypass at the [place] in [place] 3 weeks ago. He has not been there for followup. Several days ago, he started having worsening drainage from his left foot wound. He presented the emergency room. I been asked to see him concerning right groin swelling at the incision site.  Past Medical History  Diagnosis Date  . Hypertension   . Diabetes mellitus   . Stroke   . Seizures   . PVD (peripheral vascular disease)     s/p bilateral bipass 5/8  . Chronic neck pain     s/p cervical fusion  . Anxiety   . GERD (gastroesophageal reflux disease)     Past Surgical History  Procedure Laterality Date  . Cholecystectomy    . Neck surgery    . Toe amputation    . Femoral-popliteal bypass graft      No family history on file.  Social History:  reports that he has been smoking Cigarettes.  He has been smoking about 0.00 packs per day. He does not have any smokeless tobacco history on file. He reports that he does not drink alcohol or use illicit drugs.  Allergies: No Known Allergies  Medications: I have reviewed the patient's current medications.  Results for orders placed during the hospital encounter of 04/29/13 (from the past 48 hour(s))  GLUCOSE, CAPILLARY     Status: None   Collection Time    04/29/13  9:23 AM      Result Value Range   Glucose-Capillary 90  70 - 99 mg/dL  CBC WITH DIFFERENTIAL     Status: Abnormal   Collection Time    04/29/13 10:43 AM      Result Value Range   WBC 6.3  4.0 - 10.5 K/uL   RBC 4.90  4.22 - 5.81 MIL/uL   Hemoglobin 12.0 (*) 13.0 - 17.0 g/dL   HCT 84.6 (*) 96.2 - 95.2 %   MCV 77.1 (*) 78.0 - 100.0 fL   MCH 24.5 (*) 26.0 - 34.0 pg   MCHC 31.7  30.0 - 36.0 g/dL   RDW 84.1 (*) 32.4 - 40.1 %   Platelets 324  150 - 400 K/uL   Neutrophils Relative % 66  43 - 77 %   Neutro Abs 4.1   1.7 - 7.7 K/uL   Lymphocytes Relative 20  12 - 46 %   Lymphs Abs 1.2  0.7 - 4.0 K/uL   Monocytes Relative 10  3 - 12 %   Monocytes Absolute 0.6  0.1 - 1.0 K/uL   Eosinophils Relative 5  0 - 5 %   Eosinophils Absolute 0.3  0.0 - 0.7 K/uL   Basophils Relative 0  0 - 1 %   Basophils Absolute 0.0  0.0 - 0.1 K/uL  BASIC METABOLIC PANEL     Status: Abnormal   Collection Time    04/29/13 10:43 AM      Result Value Range   Sodium 139  135 - 145 mEq/L   Potassium 4.0  3.5 - 5.1 mEq/L   Chloride 100  96 - 112 mEq/L   CO2 29  19 - 32 mEq/L   Glucose, Bld 67 (*) 70 - 99 mg/dL   BUN 9  6 - 23 mg/dL   Creatinine, Ser 0.27  0.50 - 1.35 mg/dL   Calcium 9.8  8.4 - 10.5 mg/dL   GFR calc non Af Amer >90  >90 mL/min   GFR calc Af Amer >90  >90 mL/min   Comment:            The eGFR has been calculated     using the CKD EPI equation.     This calculation has not been     validated in all clinical     situations.     eGFR's persistently     <90 mL/min signify     possible Chronic Kidney Disease.  GLUCOSE, CAPILLARY     Status: None   Collection Time    04/29/13  1:28 PM      Result Value Range   Glucose-Capillary 92  70 - 99 mg/dL    Dg Foot Complete Left  04/29/2013   *RADIOLOGY REPORT*  Clinical Data: Left great toe amputation 3 weeks ago with wound infection and drainage.  LEFT FOOT - COMPLETE 3+ VIEW  Comparison: 11/08/2012.  Findings: Interval amputation of the great toe, at the metatarsal phalangeal joint.  There is irregularity and bony fragmentation along the head of the first metatarsal which appears partially absent or eroded.  Forefoot soft tissue swelling.  No additional acute osseous abnormalities.  IMPRESSION: Postoperative changes of great toe amputation with irregularity, apparent erosive change and bony fragmentation along the residual head of the first metatarsal.  Findings are rather worrisome for osteomyelitis.   Original Report Authenticated By: Ruben Lloyd, M.D.    ROS:  See chart Blood pressure 152/74, pulse 65, temperature 98.2 F (36.8 C), temperature source Oral, resp. rate 18, height 6\' 5"  (1.956 m), weight 104.463 kg (230 lb 4.8 oz), SpO2 98.00%. Physical Exam: Pleasant black male in no acute distress. Extremity examination reveals bilateral femoral incisions with some swelling and pitting edema in the right incision. No purulent drainage noted. A fem-fem bypass is present which I could not palpate a pulse. I could not palpate a pulse in the right groin swelling.  Emergency ultrasound was performed which does reveal occlusion of the fem-fem bypass. No pseudoaneurysm is seen in the right groin. There is blood flow in the native arteries bilaterally.  Assessment/Plan: Impression: Occlusion of fem-fem bypass graft Plan: I discussed this with Ruben Lloyd. We do not do vascular surgery here at [facility]. He needs to be referred back to the [facility] for further management treatment.  Ruben Lloyd A 04/29/2013, 3:54 PM

## 2013-04-29 NOTE — Progress Notes (Signed)
ANTIBIOTIC CONSULT NOTE - INITIAL  Pharmacy Consult for Vancomycin and Zosyn Indication: suspected osteomyelitis  No Known Allergies  Patient Measurements: Height: 6\' 5"  (195.6 cm) Weight: 230 lb 4.8 oz (104.463 kg) IBW/kg (Calculated) : 89.1  Vital Signs: Temp: 98.2 F (36.8 C) (06/11 1404) Temp src: Oral (06/11 1404) BP: 152/74 mmHg (06/11 1404) Pulse Rate: 65 (06/11 1404) Intake/Output from previous day:   Intake/Output from this shift:    Labs:  Recent Labs  04/29/13 1043  WBC 6.3  HGB 12.0*  PLT 324  CREATININE 0.76   Estimated Creatinine Clearance: 119.1 ml/min (by C-G formula based on Cr of 0.76). No results found for this basename: VANCOTROUGH, VANCOPEAK, VANCORANDOM, GENTTROUGH, GENTPEAK, GENTRANDOM, TOBRATROUGH, TOBRAPEAK, TOBRARND, AMIKACINPEAK, AMIKACINTROU, AMIKACIN,  in the last 72 hours   Microbiology: No results found for this or any previous visit (from the past 720 hour(s)).  Medical History: Past Medical History  Diagnosis Date  . Hypertension   . Diabetes mellitus   . Stroke   . Seizures   . PVD (peripheral vascular disease)     s/p bilateral bipass 5/8  . Chronic neck pain     s/p cervical fusion  . Anxiety   . GERD (gastroesophageal reflux disease)    Medications:  Scheduled:  . sodium chloride   Intravenous STAT  . ALPRAZolam  1 mg Oral BID  . docusate sodium  100 mg Oral Daily  . enoxaparin (LOVENOX) injection  40 mg Subcutaneous Q24H  . [START ON 04/30/2013] hydrochlorothiazide  12.5 mg Oral Daily  . insulin aspart  0-15 Units Subcutaneous TID WC  . [START ON 04/30/2013] levETIRAcetam  500 mg Oral Q breakfast   And  . levETIRAcetam  250 mg Oral BID AC  . [START ON 04/30/2013] lisinopril  10 mg Oral Daily  . [START ON 04/30/2013] pantoprazole  40 mg Oral Daily  . piperacillin-tazobactam (ZOSYN)  IV  3.375 g Intravenous Q8H  . vancomycin  1,500 mg Intravenous Once   Assessment: 62yo male with h/o left great toe amputation in May  of this year.  Admitted with c/o wound infection and increased purulent drainage.  Pt is obese with good renal fxn.  Estimated Creatinine Clearance: 119.1 ml/min (by C-G formula based on Cr of 0.76).   Goal of Therapy:  Vancomycin trough level 15-20 mcg/ml  Plan: Zosyn 3.375gm IV q8hrs to be infused over 4 hours. Vancomycin 1500mg  IV x 1 now then 1000mg  IV q8hrs Check trough at steady state Monitor labs, renal fxn, and cultures Duration of therapy per MD  Valrie Hart A 04/29/2013,2:41 PM

## 2013-04-29 NOTE — ED Notes (Signed)
Requesting pain medication. Pauline Aus, PA aware.

## 2013-04-29 NOTE — ED Provider Notes (Signed)
History     CSN: [id number]  Arrival date & time 04/29/13  4132   First MD Initiated Contact with Patient 04/29/13 0911      Chief Complaint  Patient presents with  . Wound Infection    (Consider location/radiation/quality/duration/timing/severity/associated sxs/prior treatment) HPI Comments: Ruben Lloyd is a 62 y.o. male who presents to the Emergency Department complaining of pain, swelling, and drainage from a surgical wound. Patient had a left great toe amputation 3 weeks ago and reports having drainage since surgery, but reports increased pain and over from the wound for 2-3 days. Patient states that he also had a femoropopliteal bypass at the same time as the amputation. Procedures were performed at the [place] and from [place]. He states that he has a followup visit on June 26 but denies any medical care since the procedure was performed. He reports taking oral antibiotics for 3 days after surgery but none since that time. He states pain is worse with weightbearing. He denies pain to his calf or knee. He also denies fever, chills, numbness or vomiting.  Patient states that he has a diet-controlled diabetic and has been a long-time smoker  The history is provided by the patient and the spouse.    Past Medical History  Diagnosis Date  . Hypertension   . Diabetes mellitus   . Stroke   . Seizures     Past Surgical History  Procedure Laterality Date  . Cholecystectomy    . Neck surgery    . Toe amputation    . Femoral-popliteal bypass graft      No family history on file.  History  Substance Use Topics  . Smoking status: Current Every Day Smoker    Types: Cigarettes  . Smokeless tobacco: Not on file  . Alcohol Use: No      Review of Systems  Constitutional: Negative for fever and chills.  Respiratory: Negative for chest tightness and shortness of breath.   Cardiovascular: Negative for chest pain.  Gastrointestinal: Negative for nausea and vomiting.   Genitourinary: Negative for dysuria and difficulty urinating.  Musculoskeletal: Positive for joint swelling and arthralgias.  Skin: Positive for color change and wound.  Neurological: Negative for dizziness, weakness and numbness.  Hematological: Negative for adenopathy.  Psychiatric/Behavioral: Negative for confusion. The patient is not nervous/anxious.   All other systems reviewed and are negative.    Allergies  Review of patient's allergies indicates no known allergies.  Home Medications   Current Outpatient Rx  Name  Route  Sig  Dispense  Refill  . albuterol (PROVENTIL HFA;VENTOLIN HFA) 108 (90 BASE) MCG/ACT inhaler   Inhalation   Inhale 2 puffs into the lungs every 6 (six) hours as needed. For shortness of breath         . ALPRAZolam (XANAX) 1 MG tablet   Oral   Take 0.5-1 mg by mouth 2 (two) times daily.         . cyclobenzaprine (FLEXERIL) 10 MG tablet   Oral   Take 1 tablet (10 mg total) by mouth 3 (three) times daily as needed for muscle spasms.   30 tablet   0   . docusate sodium (COLACE) 100 MG capsule   Oral   Take 100 mg by mouth daily as needed. Constipation         . gabapentin (NEURONTIN) 300 MG capsule   Oral   Take 600 mg by mouth 2 (two) times daily as needed.          Marland Kitchen  hydrochlorothiazide (HYDRODIURIL) 25 MG tablet   Oral   Take 12.5 mg by mouth daily. For Blood Pressure         . HYDROcodone-acetaminophen (NORCO/VICODIN) 5-325 MG per tablet   Oral   Take 1-2 tablets by mouth every 4 (four) hours as needed for pain.         Marland Kitchen ibuprofen (ADVIL,MOTRIN) 800 MG tablet   Oral   Take 800 mg by mouth 2 (two) times daily as needed. Pain         . levETIRAcetam (KEPPRA) 250 MG tablet   Oral   Take 250-500 mg by mouth 3 (three) times daily. 2 in the morning, 1 at lunch, and 1 at bedtime         . lisinopril (PRINIVIL,ZESTRIL) 20 MG tablet   Oral   Take 10 mg by mouth daily.          Marland Kitchen omeprazole (PRILOSEC) 20 MG capsule    Oral   Take 20 mg by mouth 2 (two) times daily. To control stomach acid           BP 139/70  Pulse 107  Temp(Src) 98.2 F (36.8 C)  Resp 18  Ht 6' 5.5" (1.969 m)  Wt 235 lb (106.595 kg)  BMI 27.49 kg/m2  SpO2 96%  Physical Exam  Nursing note and vitals reviewed. Constitutional: He is oriented to person, place, and time. He appears well-developed and well-nourished. No distress.  HENT:  Head: Normocephalic and atraumatic.  Mouth/Throat: Oropharynx is clear and moist.  Neck: Normal range of motion. Neck supple. No thyromegaly present.  Cardiovascular: Normal rate, regular rhythm, normal heart sounds and intact distal pulses.   No murmur heard. Pulmonary/Chest: Effort normal and breath sounds normal. No respiratory distress.  Abdominal: Soft. He exhibits no distension. There is no tenderness. There is no rebound and no guarding.  Musculoskeletal: He exhibits edema and tenderness.       Left foot: He exhibits tenderness and swelling. He exhibits normal range of motion, normal capillary refill and no crepitus.  Status post amputation of the left great toe. Sutures intact. Purulent drainage and malodor are present. Proximal foot is slightly erythematous. No lymphangitis. No calf pain or edema. Dorsalis pedis pulse was heard with a portable Doppler. Distal sensation to the remaining toes is intact  Lymphadenopathy:    He has no cervical adenopathy.  Neurological: He is alert and oriented to person, place, and time. He exhibits normal muscle tone. Coordination normal.  Skin: Skin is warm. No rash noted.  Psychiatric: He has a normal mood and affect.    ED Course  Procedures (including critical care time)  Labs Reviewed  CBC WITH DIFFERENTIAL - Abnormal; Notable for the following:    Hemoglobin 12.0 (*)    HCT 37.8 (*)    MCV 77.1 (*)    MCH 24.5 (*)    RDW 16.5 (*)    All other components within normal limits  BASIC METABOLIC PANEL - Abnormal; Notable for the following:     Glucose, Bld 67 (*)    All other components within normal limits  CULTURE, BLOOD (ROUTINE X 2)  CULTURE, BLOOD (ROUTINE X 2)  GLUCOSE, CAPILLARY   Dg Foot Complete Left  04/29/2013   *RADIOLOGY REPORT*  Clinical Data: Left great toe amputation 3 weeks ago with wound infection and drainage.  LEFT FOOT - COMPLETE 3+ VIEW  Comparison: 11/08/2012.  Findings: Interval amputation of the great toe, at the metatarsal phalangeal joint.  There is irregularity and bony fragmentation along the head of the first metatarsal which appears partially absent or eroded.  Forefoot soft tissue swelling.  No additional acute osseous abnormalities.  IMPRESSION: Postoperative changes of great toe amputation with irregularity, apparent erosive change and bony fragmentation along the residual head of the first metatarsal.  Findings are rather worrisome for osteomyelitis.   Original Report Authenticated By: Leanna Battles, M.D.     IV Zosyn given, as well as morphine and Zofran. Blood cultures are pending   MDM    1145  I have discussed lab results and care plan with the patient.  His PMD is at [facility] in [place].  He agrees to admission and prefers to be admitted here.  I will consult hospitalist for admission.  VSS.  He is non-toxic appearing at this time.   Patient has also been seen by Dr. Estell Harpin and care plan discussed.     1225  Consulted Dr. Kerry Hough and agrees to admit , team 2, regular bed with  Temporary admit orders  Lorrena Goranson L. Trisha Mangle, PA-C 04/29/13 1243

## 2013-04-29 NOTE — Progress Notes (Signed)
UR chart review completed.  

## 2013-04-29 NOTE — H&P (Signed)
[facility] History and Physical  GLEEN RIPBERGER WGN:[id number] DOB: 20-Aug-1951 DOA: 04/29/2013  Referring physician:  PCP: Provider Not In System  Specialists:   Chief Complaint: wound infection  HPI: Ruben Lloyd is a very pleasant 62 y.o. male with past medical history that includes hypertension, seizure disorder, diabetes but is not on medication, anxiety, GERD, PVD, presents to the emergency department with the chief complaint of wound infection. Information obtained from the patient and his wife who is at the bedside. They report that in October of 2013 patient hit his left great toe. It became swollen and red. It became infected. Over the next several months the pain increased the swelling increased the tissue at the tip of the toe turned black. He was seen at the [place] in [place]. He was advised at that time that he would likely "lose his toe". During this evaluation he was also discovered to have vascular insufficiency. On May 8 of this year he underwent bilateral bypass to lower extremities. On May 19 of this year he underwent a great left toe amputation. He was scheduled to go back for followup at the end of May but was unable to make that appointment. During this time he changed the dressing to the left great toe on a daily basis. Wife reports that it was doing fine until about a week ago when it became pinkish and a little swollen. Over the course of the week the swelling and redness increased. Wife also reports that during this timeframe there was an increased in greenish purulent drainage. Patient states that the pain increased as well. she indicates that this morning when she changed the dressing there was a foul odor. Patient reports over the last 2 days some intermittent nausea without vomiting, chills, subjective fever, and 2 episodes of diarrhea. Workup in the emergency room includes a x-ray of left foot that yields a rest of change and bony fragmentation along the residual head of the  first metatarsal concerning for osteomyelitis. Symptoms came on gradually have persisted and worsened. Characterized as moderate. [facility] asked to admit   Review of Systems: 14 point review of systems all negative except as indicated in history of present illness   Past Medical History  Diagnosis Date  . Hypertension   . Diabetes mellitus   . Stroke   . Seizures   . PVD (peripheral vascular disease)     s/p bilateral bipass 5/8  . Chronic neck pain     s/p cervical fusion  . Anxiety   . GERD (gastroesophageal reflux disease)    Past Surgical History  Procedure Laterality Date  . Cholecystectomy    . Neck surgery    . Toe amputation    . Femoral-popliteal bypass graft     Social History: Patient reports smoking about a pack a day, denies EtOH or illicit drug use. Lives with his wife and has done so for the last 44 years is self-employed as a Surveyor, [employer] independent with ADLs    No Known Allergies  No family history on file. family medical history is positive for hypertension, diabetes, stroke, MRI, brain cancer  Prior to Admission medications   Medication Sig Start Date End Date Taking? Authorizing Provider  albuterol (PROVENTIL HFA;VENTOLIN HFA) 108 (90 BASE) MCG/ACT inhaler Inhale 2 puffs into the lungs every 6 (six) hours as needed. For shortness of breath   Yes Historical Provider, MD  ALPRAZolam Prudy Feeler) 1 MG tablet Take 0.5-1 mg by mouth 2 (two) times  daily.   Yes Historical Provider, MD  cyclobenzaprine (FLEXERIL) 10 MG tablet Take 1 tablet (10 mg total) by mouth 3 (three) times daily as needed for muscle spasms. 10/06/12  Yes [name], MD  docusate sodium (COLACE) 100 MG capsule Take 100 mg by mouth daily as needed. Constipation   Yes Historical Provider, MD  gabapentin (NEURONTIN) 300 MG capsule Take 600 mg by mouth 2 (two) times daily as needed.    Yes Historical Provider, MD  hydrochlorothiazide (HYDRODIURIL) 25 MG tablet Take 12.5 mg by mouth daily.  For Blood Pressure   Yes Historical Provider, MD  HYDROcodone-acetaminophen (NORCO/VICODIN) 5-325 MG per tablet Take 1-2 tablets by mouth every 4 (four) hours as needed for pain.   Yes Historical Provider, MD  ibuprofen (ADVIL,MOTRIN) 800 MG tablet Take 800 mg by mouth 2 (two) times daily as needed. Pain   Yes Historical Provider, MD  levETIRAcetam (KEPPRA) 250 MG tablet Take 250-500 mg by mouth 3 (three) times daily. 2 in the morning, 1 at lunch, and 1 at bedtime   Yes Historical Provider, MD  lisinopril (PRINIVIL,ZESTRIL) 20 MG tablet Take 10 mg by mouth daily.    Yes Historical Provider, MD  omeprazole (PRILOSEC) 20 MG capsule Take 20 mg by mouth 2 (two) times daily. To control stomach acid   Yes Historical Provider, MD   Physical Exam: Filed Vitals:   04/29/13 0853 04/29/13 1213  BP: 139/70 143/71  Pulse: 107 80  Temp: 98.2 F (36.8 C) 98 F (36.7 C)  TempSrc:  Oral  Resp: 18 19  Height: 6' 5.5" (1.969 m)   Weight: 106.595 kg (235 lb)   SpO2: 96% 97%     General:  Well-nourished pleasant no acute distress  Eyes: PERRLA, EOMI,  ENT: Ears clear nose without drainage, oropharynx pink moist no exudate, very poor dentition  Neck: Supple no JVD no lymphadenopathy  Cardiovascular: Regular rate and rhythm no murmur gallop or rub no lower extremity edema  Respiratory: Normal effort breath sounds somewhat distant faint expiratory wheeze slight coarseness no crackles no rhonchi  Abdomen: Round soft positive bowel sounds nontender to palpation no mass organomegaly noted  Skin: Status post amputation of left great toe. Sutures intact. Small amount of purulent drainage greenish in color and malodorous. The foot and second toe with erythema mild tenderness to palpation. Foot warm to touch sensation intact. Incision site at left groin with staples intact no erythema or swelling distal staple gone small open area without erythema drainage or odor. Incision site of right groin with staples and  packed mid incision with swelling erythema warmth tenderness small amount of serosanguineous drainage.  Musculoskeletal: See above otherwise joints without swelling or erythema  Psychiatric: Calm cooperative  Neurologic: Cranial nerves II through XII grossly intact speech clear facial symmetry  Labs on Admission:  Basic Metabolic Panel:  Recent Labs Lab 04/29/13 1043  NA 139  K 4.0  CL 100  CO2 29  GLUCOSE 67*  BUN 9  CREATININE 0.76  CALCIUM 9.8   Liver Function Tests: No results found for this basename: AST, ALT, ALKPHOS, BILITOT, PROT, ALBUMIN,  in the last 168 hours No results found for this basename: LIPASE, AMYLASE,  in the last 168 hours No results found for this basename: AMMONIA,  in the last 168 hours CBC:  Recent Labs Lab 04/29/13 1043  WBC 6.3  NEUTROABS 4.1  HGB 12.0*  HCT 37.8*  MCV 77.1*  PLT 324   Cardiac Enzymes: No results found  for this basename: CKTOTAL, CKMB, CKMBINDEX, TROPONINI,  in the last 168 hours  BNP (last 3 results) No results found for this basename: PROBNP,  in the last 8760 hours CBG:  Recent Labs Lab 04/29/13 0923 04/29/13 1328  GLUCAP 90 92    Radiological Exams on Admission: Dg Foot Complete Left  04/29/2013   *RADIOLOGY REPORT*  Clinical Data: Left great toe amputation 3 weeks ago with wound infection and drainage.  LEFT FOOT - COMPLETE 3+ VIEW  Comparison: 11/08/2012.  Findings: Interval amputation of the great toe, at the metatarsal phalangeal joint.  There is irregularity and bony fragmentation along the head of the first metatarsal which appears partially absent or eroded.  Forefoot soft tissue swelling.  No additional acute osseous abnormalities.  IMPRESSION: Postoperative changes of great toe amputation with irregularity, apparent erosive change and bony fragmentation along the residual head of the first metatarsal.  Findings are rather worrisome for osteomyelitis.   Original Report Authenticated By: Leanna Battles,  M.D.    EKG: Independently reviewed.   Assessment/Plan Principal Problem:   Osteomyelitis: Status post left great toe amputation on May 19 of this year. Will admit to medical floor. Will provide bank and Zosyn per pharmacy. Patient currently is nontoxic afebrile. Will request surgical consult.  Incisional infection: Patient status post bilateral femoropopliteal bypass on May 8 at this year. Incision and right groin staples intact. Mid incision with moderate amount of swelling erythema and warmth to touch. Small amount of serosanguineous drainage. Will request evaluation by surgery. Antibiotics as above.  Active Problems:   HTN (hypertension): Controlled. Will continue home medications appear    DM (diabetes mellitus): Patient not on medications. Does not monitor her glucose level or adhere to car modified diet. Will check hemoglobin A1c. Will use sliding scale insulin for glycemic control.    Tobacco use: Counseled to smoking cessation.    Seizure: No seizure activity in several months. Will continue Keppra.    PVD (peripheral vascular disease): See #2.    GERD (gastroesophageal reflux disease): Stable at baseline.    Anxiety: Stable at baseline. Will continue home meds    Chronic neck pain: Status post cervical fusion. Stable at baseline. Will continue home medications   Code Status: Full Family Communication: Wife at bedside Disposition Plan: He will need transfer to the [facility] for further care Time spent: 65 minutes  Gwenyth Bender [facility] Pager [phone]  If 7PM-7AM, please contact night-coverage [email or web address] Password [email or web address] 04/29/2013, 1:50 PM  Attending note:  Patient seen and examined.  Above note reviewed.  Patient presents with non healing wound of his left foot.  He recently had surgery on may 16th at the [facility] in [facility], where he had an amputation of his left great toe. This has progressively deteriorated, now has malodorous discharge and xray of  foot has concerns for underlying osteomyelitis.  Patient recently also has vascular surgery on may 8th at the [place] in [place].  He had a fem-fem bypass at that time. He has not attended any of his follow up appointments and still has staples in both groins in place.  His incision sites appear swollen and infected.  Case was discussed with general surgeon, Dr. Lovell Sheehan who saw the patient in consultation.  Arterial dopplers were ordered which indicated an occluded graft.  Patient will need to be seen by vascular surgery again and will need services that cannot be provided at this hospital. I have requested transfer of patient back to the [facility]  in [place]. They will fax a transfer packet to start this process.  We will start him on broad spectrum antibiotics for now.  He does not appear septic/toxic.  His leg feels warm and pulses were appreciated by ED provider via doppler.  Duante Arocho

## 2013-04-29 NOTE — ED Notes (Signed)
Pt c/o increased pain/drainage in left great toe amputation site x 2 days.

## 2013-04-29 NOTE — Discharge Summary (Addendum)
Physician Discharge Summary  Ruben Lloyd ZOX:[id number] DOB: 04/10/1951 DOA: 04/29/2013  PCP: Provider Not In System  Admit date: 04/29/2013 Discharge date: 05/02/2013  Time spent: 45 minutes  Recommendations for Outpatient Follow-up:  1. Patient will be transferred to the [facility] medical center in [facility] for further treatments  Discharge Diagnoses:  Principal Problem:   Osteomyelitis Active Problems:   HTN (hypertension)   DM (diabetes mellitus)   Tobacco use   Seizure   PVD (peripheral vascular disease)   GERD (gastroesophageal reflux disease)   Anxiety   Chronic neck pain   Incisional infection Occluded fem-fem bypass graft  Discharge Condition: stable  Diet recommendation: carb modified  Filed Weights   04/29/13 0853 04/29/13 1404  Weight: 106.595 kg (235 lb) 104.463 kg (230 lb 4.8 oz)    History of present illness:  TRAPPER MEECH is a very pleasant 62 y.o. male with past medical history that includes hypertension, seizure disorder, diabetes but is not on medication, anxiety, GERD, PVD, presents to the emergency department with the chief complaint of wound infection. Information obtained from the patient and his wife who is at the bedside. They report that in October of 2013 patient hit his left great toe. It became swollen and red. It became infected. Over the next several months the pain increased the swelling increased the tissue at the tip of the toe turned black. He was seen at the [place] in [place]. He was advised at that time that he would likely "lose his toe". During this evaluation he was also discovered to have vascular insufficiency. On May 8 of this year he underwent bilateral bypass to lower extremities. On May 19 of this year he underwent a great left toe amputation. He was scheduled to go back for followup at the end of May but was unable to make that appointment. During this time he changed the dressing to the left great toe on a daily basis. Wife reports that it was  doing fine until about a week ago when it became pinkish and a little swollen. Over the course of the week the swelling and redness increased. Wife also reports that during this timeframe there was an increased in greenish purulent drainage. Patient states that the pain increased as well. she indicates that this morning when she changed the dressing there was a foul odor. Patient reports over the last 2 days some intermittent nausea without vomiting, chills, subjective fever, and 2 episodes of diarrhea. Workup in the emergency room includes a x-ray of left foot that yields a rest of change and bony fragmentation along the residual head of the first metatarsal concerning for osteomyelitis. Symptoms came on gradually have persisted and worsened. Characterized as moderate. [facility] asked to admit   Hospital Course:  This patient was admitted to [facility] earlier today for osteomyelitis of his left foot. The patient was recently seen at the [facility], last month and underwent fem-fem bypass grafting. He also had left great toe amputation done last month. He presented to our emergency room today with complaints of worsening discharge and odor from his left foot wound. He has not sought medical care since he was discharged from the hospital. He has noted worsening drainage, swelling, erythema from his wound. X-rays done in the emergency room indicated concern for underlying osteomyelitis. He was admitted to the hospital for further management. He was seen in consultation by general surgery. It was noted that he had swelling in his right  groin over his bypass incision site. Arterial Dopplers were ordered which revealed that his bypass grafts his completely occluded. It was recommended that patient would need further vascular surgery evaluation. The case was discussed with Dr. Guy Begin with vascular surgery at the [facility] who has kindly accepted the patient in transfer.  The patient has been started on IV antibiotics with vancomycin and Zosyn, but he will need further definitive care at the [facility]. Once a bed is available, he may be transferred.  Procedures:  See below for imaging studies  Consultations:  General Surgery, Dr. Lovell Sheehan  Discharge Exam: Filed Vitals:   05/01/13 0500 05/01/13 1611 05/01/13 2121 05/02/13 0629  BP: 163/89 153/63 129/79 133/77  Pulse: 92 89 82 84  Temp: 97.4 F (36.3 Lloyd) 98.4 F (36.9 Lloyd) 97.7 F (36.5 Lloyd) 97.8 F (36.6 Lloyd)  TempSrc: Oral Oral Oral Oral  Resp: 20 20  20   Height:      Weight: 100.608 kg (221 lb 12.8 oz)     SpO2: 100% 98% 96% 95%    General:NAD Cardiovascular: S1, S2 RRR Respiratory: CTA B  Discharge Instructions     Medication List    TAKE these medications       albuterol 108 (90 BASE) MCG/ACT inhaler  Commonly known as:  PROVENTIL HFA;VENTOLIN HFA  Inhale 2 puffs into the lungs every 6 (six) hours as needed. For shortness of breath     ALPRAZolam 1 MG tablet  Commonly known as:  XANAX  Take 1 mg by mouth 3 (three) times daily.     cyclobenzaprine 10 MG tablet  Commonly known as:  FLEXERIL  Take 1 tablet (10 mg total) by mouth 3 (three) times daily as needed for muscle spasms.     docusate sodium 100 MG capsule  Commonly known as:  COLACE  Take 100 mg by mouth daily as needed. Constipation     gabapentin 300 MG capsule  Commonly known as:  NEURONTIN  Take 600 mg by mouth 2 (two) times daily as needed.     hydrochlorothiazide 25 MG tablet  Commonly known as:  HYDRODIURIL  Take 12.5 mg by mouth daily. For Blood Pressure     HYDROcodone-acetaminophen 5-325 MG per tablet  Commonly known as:  NORCO/VICODIN  Take 1-2 tablets by mouth every 4 (four) hours as needed for pain.     ibuprofen 800 MG tablet  Commonly known as:  ADVIL,MOTRIN  Take 800 mg by mouth 2 (two) times daily as needed. Pain     levETIRAcetam 250 MG tablet  Commonly known as:  KEPPRA  Take 250-500  mg by mouth 3 (three) times daily. 2 in the morning, 1 at lunch, and 1 at bedtime     lisinopril 20 MG tablet  Commonly known as:  PRINIVIL,ZESTRIL  Take 10 mg by mouth daily.     omeprazole 20 MG capsule  Commonly known as:  PRILOSEC  Take 20 mg by mouth 2 (two) times daily. To control stomach acid     piperacillin-tazobactam 3.375 GM/50ML IVPB  Commonly known as:  ZOSYN  Inject 50 mLs (3.375 g total) into the vein every 8 (eight) hours.     vancomycin 1 GM/200ML Soln  Commonly known as:  VANCOCIN  Inject 200 mLs (1,000 mg total) into the vein every 8 (eight) hours.       No Known Allergies    The results of significant diagnostics from this hospitalization (including imaging, microbiology, ancillary and laboratory)  are listed below for reference.    Significant Diagnostic Studies: [place] Lower Ext Art Right Ltd  04/29/2013   *RADIOLOGY REPORT*  Clinical Data: Right groin swelling.  Evaluate for pseudoaneurysm or occlusion. History of femoral-femoral bypass graft.  RIGHT LOWER EXTREMITY ARTERIAL DUPLEX EVAL  Comparison: None.  Findings: The femoral-femoral bypass graft is occluded.  There is arterial flow in the common femoral artery regions bilaterally. The left superficial femoral artery is patent with monophasic Doppler waveform.  There is plaque in the right superficial femoral artery with some flow.  Monophasic Doppler waveforms in the right superficial femoral artery.  There are lymph nodes in both groins.  IMPRESSION: The femoral-femoral bypass graft is occluded.  Arterial flow in the common femoral artery regions and bilateral proximal superficial femoral arteries.  Atherosclerotic plaque in the right superficial femoral artery.  Further evaluation of the arterial disease could be performed with a CTA examination.   Original Report Authenticated By: Richarda Overlie, M.D.   Dg Foot Complete Left  04/29/2013   *RADIOLOGY REPORT*  Clinical Data: Left great toe amputation 3 weeks ago with  wound infection and drainage.  LEFT FOOT - COMPLETE 3+ VIEW  Comparison: 11/08/2012.  Findings: Interval amputation of the great toe, at the metatarsal phalangeal joint.  There is irregularity and bony fragmentation along the head of the first metatarsal which appears partially absent or eroded.  Forefoot soft tissue swelling.  No additional acute osseous abnormalities.  IMPRESSION: Postoperative changes of great toe amputation with irregularity, apparent erosive change and bony fragmentation along the residual head of the first metatarsal.  Findings are rather worrisome for osteomyelitis.   Original Report Authenticated By: Leanna Battles, M.D.        Labs: Basic Metabolic Panel:  Recent Labs Lab 04/29/13 1043 04/30/13 0555 05/01/13 0516  NA 139 138 138  K 4.0 4.0 4.0  CL 100 103 100  CO2 29 27 29   GLUCOSE 67* 96 99  BUN 9 9 8   CREATININE 0.76 0.79 0.89  CALCIUM 9.8 9.3 9.9      CBC:  Recent Labs Lab 04/29/13 1043 04/30/13 0555 05/02/13 0437  WBC 6.3 5.0 5.3  NEUTROABS 4.1  --   --   HGB 12.0* 11.2* 11.5*  HCT 37.8* 34.8* 36.7*  MCV 77.1* 76.7* 76.9*  PLT 324 264 290    CBG:  Recent Labs Lab 05/01/13 0748 05/01/13 1150 05/01/13 1708 05/01/13 2123 05/02/13 0734  GLUCAP 117* 115* 85 117* 95       Signed:  Isaah Furry Lloyd  [facility] 05/02/2013, 11:15 AM

## 2013-04-29 NOTE — Progress Notes (Deleted)
Pt is to be discharged home today. Pt is in NAD, IV is out, all paperwork has been reviewed/discussed with patient, and there are no questions/concerns at this time. Assessment is unchanged from this morning. Pt is to be accompanied downstairs by staff via wheelchair.  

## 2013-04-29 NOTE — Care Management Note (Unsigned)
    Page 1 of 2   05/01/2013     1:52:11 PM   CARE MANAGEMENT NOTE 05/01/2013  Patient:  Ruben Lloyd, Ruben Lloyd   Account Number:  [id number]  Date Initiated:  04/29/2013  Documentation initiated by:  Sharrie Rothman  Subjective/Objective Assessment:   Pt admitted from home with osteomylitis of foot. Pt had recently had amputation of great toe at [facility]. Pt lives with his wife and will return home at discharge.     Action/Plan:   Cm spoke with pt about transfering to [facility] and he stated he would go if they request him to. Called [facility] [place] and left message on transfer coordinators phone.   Anticipated DC Date:  05/04/2013   Anticipated DC Plan:  ACUTE TO ACUTE TRANS      DC Planning Services  CM consult  [facility] referrals / transfers      Choice offered to / List presented to:             Status of service:  In process, will continue to follow Medicare Important Message given?   (If response is "NO", the following Medicare IM given date fields will be blank) Date Medicare IM given:   Date Additional Medicare IM given:    Discharge Disposition:    Per UR Regulation:    If discussed at Long Length of Stay Meetings, dates discussed:    Comments:  05/01/13 1342 Arlyss Queen, RN BSN CM CM contacted by Shela Nevin at [facility] requesting latest progress notes and updates. Facility still has no bed availability at this time. CM called facility after lunch and left message and phone number for facility to call when bed dose become available.  04/30/13 1345 Arlyss Queen, RN BSN CM CM spoke with Okey Regal- Clinical cytogeneticist at [facility] and we are still awaiting bed availability so that pt can transfer. The facility has received all paperwork according to [facility] for transfer and accepting MD is Dr. Guy Begin. CM called again at 1340 and had to leave a message. CM did leave direct number for facility to call once be is available.  04/29/13 1510 Arlyss Queen, RN BSN CM

## 2013-04-29 NOTE — Progress Notes (Signed)
Provided and reviewed handout with pt on smoking cessation. Pt has no new questions at this time. Will continue to monitor.

## 2013-04-30 DIAGNOSIS — I10 Essential (primary) hypertension: Secondary | ICD-10-CM

## 2013-04-30 DIAGNOSIS — L0390 Cellulitis, unspecified: Secondary | ICD-10-CM

## 2013-04-30 DIAGNOSIS — E119 Type 2 diabetes mellitus without complications: Secondary | ICD-10-CM

## 2013-04-30 DIAGNOSIS — L0291 Cutaneous abscess, unspecified: Secondary | ICD-10-CM

## 2013-04-30 DIAGNOSIS — M869 Osteomyelitis, unspecified: Secondary | ICD-10-CM

## 2013-04-30 LAB — CBC
HCT: 34.8 % — ABNORMAL LOW (ref 39.0–52.0)
MCHC: 32.2 g/dL (ref 30.0–36.0)
MCV: 76.7 fL — ABNORMAL LOW (ref 78.0–100.0)
RDW: 16.4 % — ABNORMAL HIGH (ref 11.5–15.5)

## 2013-04-30 LAB — BASIC METABOLIC PANEL
BUN: 9 mg/dL (ref 6–23)
Calcium: 9.3 mg/dL (ref 8.4–10.5)
Chloride: 103 mEq/L (ref 96–112)
Creatinine, Ser: 0.79 mg/dL (ref 0.50–1.35)
GFR calc Af Amer: >90 mL/min (ref >90)

## 2013-04-30 LAB — GLUCOSE, CAPILLARY

## 2013-04-30 LAB — HEMOGLOBIN A1C: Hgb A1c MFr Bld: 6.2 % — ABNORMAL HIGH (ref <5.7)

## 2013-04-30 MED ORDER — DOCUSATE SODIUM 100 MG PO CAPS
100.0000 mg | ORAL_CAPSULE | Freq: Two times a day (BID) | ORAL | Status: DC
  Administered 2013-04-30 – 2013-05-02 (×4): 100 mg via ORAL
  Filled 2013-04-30 (×4): qty 1

## 2013-04-30 NOTE — Progress Notes (Addendum)
No changes since discharge summary dated 04/29/13.  Pt to be transferred to [facility] in [place] when bed ready.   PE: Cardiovascular: Regular rate and rhythm no murmur gallop or rub no lower extremity edema  Respiratory: Normal effort breath sounds somewhat distant faint expiratory wheeze slight coarseness no crackles no rhonchi   Abdomen: Round soft positive bowel sounds nontender to palpation no mass organomegaly noted   Skin: Status post amputation of left great toe. Sutures intact. Small amount of purulent drainage greenish in color and malodorous. The foot and second toe with erythema mild tenderness to palpation. Foot warm to touch sensation intact. Incision site at left groin with staples intact no erythema or swelling distal staple gone small open area without erythema drainage or odor. Incision site of right groin with staples and packed mid incision with swelling erythema warmth tenderness small amount of serosanguineous drainage.   Musculoskeletal: See above otherwise joints without swelling or erythema Psychiatric: Calm cooperative   Toya Smothers NP  Patient seen and examined. Medically stable and awaiting bed from the [place].

## 2013-04-30 NOTE — Progress Notes (Signed)
Pt c/o constipation. Requesting medication. Dr. Karilyn Cota paged and made aware. Will continue to monitor.

## 2013-04-30 NOTE — Progress Notes (Signed)
Nutrition Brief Note  Patient identified on the Malnutrition Screening Tool (MST) Report  Body mass index is 27.3 kg/(m^2). Patient meets criteria for overweight based on current BMI.   Current diet order is CHO Modified (1600-2000 kcal) , patient is consuming approximately 100% of meals at this time. Labs and medications reviewed.  He is being transferred to [facility] when bed is available.  No nutrition interventions warranted at this time. If nutrition issues arise, please consult RD.   Royann Shivers MS,RD,LDN,CSG Office: [phone] Pager: [phone]

## 2013-05-01 LAB — BASIC METABOLIC PANEL
CO2: 29 mEq/L (ref 19–32)
Calcium: 9.9 mg/dL (ref 8.4–10.5)
Creatinine, Ser: 0.89 mg/dL (ref 0.50–1.35)
Glucose, Bld: 99 mg/dL (ref 70–99)

## 2013-05-01 LAB — GLUCOSE, CAPILLARY: Glucose-Capillary: 85 mg/dL (ref 70–99)

## 2013-05-01 LAB — VANCOMYCIN, TROUGH: Vancomycin Tr: 16.7 ug/mL (ref 10.0–20.0)

## 2013-05-01 MED ORDER — INSULIN ASPART 100 UNIT/ML ~~LOC~~ SOLN
3.0000 [IU] | Freq: Three times a day (TID) | SUBCUTANEOUS | Status: DC
  Administered 2013-05-01 – 2013-05-02 (×3): 3 [IU] via SUBCUTANEOUS

## 2013-05-01 MED ORDER — INSULIN ASPART 100 UNIT/ML ~~LOC~~ SOLN: 0.0000 [IU] | Freq: Three times a day (TID) | SUBCUTANEOUS | Status: DC

## 2013-05-01 NOTE — Progress Notes (Signed)
[facility] PROGRESS NOTE  Ruben Lloyd NFA:[id number] DOB: 10-31-1951 DOA: 04/29/2013 PCP: Provider Not In System  Assessment/Plan: Osteomyelitis: Status post left great toe amputation on May 19 of this year. Continue Vanc and Zosyn day #3. Patient remains nontoxic afebrile. Appreciate  surgical consult.   Occlusion of fem-fem bypas graft. :  Arterial Dopplers were ordered which revealed that his bypass grafts his completely occluded. It was recommended that patient would need further vascular surgery evaluation. The case was discussed with Dr. Guy Begin with vascular surgery at the [facility] who has kindly accepted the patient in transfer. The patient has been started on IV antibiotics with vancomycin and Zosyn, but he will need further definitive care at the [facility]. Once a bed is available, he may be transferred.  Active Problems:  HTN (hypertension): Controlled. Will continue home medications  DM (diabetes mellitus): uncontrolled. Patient not on medications. Does not monitor  glucose level or adhere to car modified diet. Hemoglobin A1c 6.2. Will use sliding scale insulin for glycemic control and meal coverage   Tobacco use: Counseled to smoking cessation.   Seizure: No seizure activity in several months. Will continue Keppra.   PVD (peripheral vascular disease): See #2.   GERD (gastroesophageal reflux disease): Stable at baseline.  Anxiety: Stable at baseline. Will continue home meds    Code Status: full Family Communication:  Disposition Plan: to [place] in [place] when bed ready   Consultants:  surgery  Procedures:  none  Antibiotics: Vancomycin 04/29/13>> Zosyn 04/29/13>>> HPI/Subjective: No complaints  Objective: Filed Vitals:   04/30/13 1459 04/30/13 1921 04/30/13 2059 05/01/13 0500  BP: 156/82  144/66 163/89  Pulse: 92  90 92  Temp: 98 F (36.7 C)  98.1 F (36.7 C) 97.4 F (36.3 C)  TempSrc:   Oral Oral  Resp: 18  20 20   Height:       Weight:    100.608 kg (221 lb 12.8 oz)  SpO2: 95% 97%  100%    Intake/Output Summary (Last 24 hours) at 05/01/13 1023 Last data filed at 05/01/13 0749  Gross per 24 hour  Intake   1230 ml  Output   3225 ml  Net  -1995 ml   Filed Weights   04/29/13 0853 04/29/13 1404 05/01/13 0500  Weight: 106.595 kg (235 lb) 104.463 kg (230 lb 4.8 oz) 100.608 kg (221 lb 12.8 oz)    Exam:   General:  Well nourished NAD  Cardiovascular: RRR No MGR No LE edema  Respiratory: normal effort BS distant diffuse expiratory wheeze   Abdomen: soft +BS non-tender to palpation  Musculoskeletal: no clubbing no cyanoss  Skin Status post amputation of left great toe. Sutures intact. Small amount of purulent drainage greenish in color and malodorous. The foot and second toe with erythema mild tenderness to palpation. Foot warm to touch sensation intact. Incision site at left groin with staples intact no erythema or swelling distal staple gone small open area without erythema drainage or odor. Incision site of right groin with staples and packed mid incision with swelling erythema warmth tenderness small amount of serosanguineous drainage. Bilateral feet warm to touch.     Data Reviewed: Basic Metabolic Panel:  Recent Labs Lab 04/29/13 1043 04/30/13 0555 05/01/13 0516  NA 139 138 138  K 4.0 4.0 4.0  CL 100 103 100  CO2 29 27 29   GLUCOSE 67* 96 99  BUN 9 9 8   CREATININE 0.76 0.79 0.89  CALCIUM 9.8 9.3 9.9  Liver Function Tests: No results found for this basename: AST, ALT, ALKPHOS, BILITOT, PROT, ALBUMIN,  in the last 168 hours No results found for this basename: LIPASE, AMYLASE,  in the last 168 hours No results found for this basename: AMMONIA,  in the last 168 hours CBC:  Recent Labs Lab 04/29/13 1043 04/30/13 0555  WBC 6.3 5.0  NEUTROABS 4.1  --   HGB 12.0* 11.2*  HCT 37.8* 34.8*  MCV 77.1* 76.7*  PLT 324 264   Cardiac Enzymes: No results found for this basename: CKTOTAL, CKMB,  CKMBINDEX, TROPONINI,  in the last 168 hours BNP (last 3 results) No results found for this basename: PROBNP,  in the last 8760 hours CBG:  Recent Labs Lab 04/30/13 0718 04/30/13 1131 04/30/13 1641 04/30/13 2055 05/01/13 0748  GLUCAP 108* 91 109* 232* 117*    Recent Results (from the past 240 hour(s))  CULTURE, BLOOD (ROUTINE X 2)     Status: None   Collection Time    04/29/13 10:43 AM      Result Value Range Status   Specimen Description BLOOD RIGHT ANTECUBITAL   Final   Special Requests BOTTLES DRAWN AEROBIC AND ANAEROBIC 6CC   Final   Culture NO GROWTH 1 DAY   Final   Report Status PENDING   Incomplete  CULTURE, BLOOD (ROUTINE X 2)     Status: None   Collection Time    04/29/13 10:57 AM      Result Value Range Status   Specimen Description BLOOD LEFT ANTECUBITAL   Final   Special Requests BOTTLES DRAWN AEROBIC AND ANAEROBIC 6CC   Final   Culture NO GROWTH 1 DAY   Final   Report Status PENDING   Incomplete     Studies: [place] Lower Ext Art Right Ltd  04/29/2013   *RADIOLOGY REPORT*  Clinical Data: Right groin swelling.  Evaluate for pseudoaneurysm or occlusion. History of femoral-femoral bypass graft.  RIGHT LOWER EXTREMITY ARTERIAL DUPLEX EVAL  Comparison: None.  Findings: The femoral-femoral bypass graft is occluded.  There is arterial flow in the common femoral artery regions bilaterally. The left superficial femoral artery is patent with monophasic Doppler waveform.  There is plaque in the right superficial femoral artery with some flow.  Monophasic Doppler waveforms in the right superficial femoral artery.  There are lymph nodes in both groins.  IMPRESSION: The femoral-femoral bypass graft is occluded.  Arterial flow in the common femoral artery regions and bilateral proximal superficial femoral arteries.  Atherosclerotic plaque in the right superficial femoral artery.  Further evaluation of the arterial disease could be performed with a CTA examination.   Original Report  Authenticated By: Richarda Overlie, M.D.   Dg Foot Complete Left  04/29/2013   *RADIOLOGY REPORT*  Clinical Data: Left great toe amputation 3 weeks ago with wound infection and drainage.  LEFT FOOT - COMPLETE 3+ VIEW  Comparison: 11/08/2012.  Findings: Interval amputation of the great toe, at the metatarsal phalangeal joint.  There is irregularity and bony fragmentation along the head of the first metatarsal which appears partially absent or eroded.  Forefoot soft tissue swelling.  No additional acute osseous abnormalities.  IMPRESSION: Postoperative changes of great toe amputation with irregularity, apparent erosive change and bony fragmentation along the residual head of the first metatarsal.  Findings are rather worrisome for osteomyelitis.   Original Report Authenticated By: Leanna Battles, M.D.    Scheduled Meds: . ALPRAZolam  1 mg Oral BID  . docusate sodium  100 mg Oral  BID  . enoxaparin (LOVENOX) injection  40 mg Subcutaneous Q24H  . hydrochlorothiazide  12.5 mg Oral Daily  . insulin aspart  0-15 Units Subcutaneous TID WC  . levETIRAcetam  500 mg Oral Q breakfast   And  . levETIRAcetam  250 mg Oral BID AC  . lisinopril  10 mg Oral Daily  . pantoprazole  40 mg Oral Daily  . piperacillin-tazobactam (ZOSYN)  IV  3.375 g Intravenous Q8H  . vancomycin  1,000 mg Intravenous Q8H   Continuous Infusions:   Principal Problem:   Osteomyelitis Active Problems:   HTN (hypertension)   DM (diabetes mellitus)   Tobacco use   Seizure   PVD (peripheral vascular disease)   GERD (gastroesophageal reflux disease)   Anxiety   Chronic neck pain   Incisional infection    Time spent: 30 minutes    [facility] M  [facility] Pager [phone]. If 7PM-7AM, please contact night-coverage at [email or web address], password [facility] 05/01/2013, 10:23 AM  LOS: 2 days   Attending: Patient seen and examined. The patient is medically stable. Awaiting transfer to the [place].

## 2013-05-01 NOTE — Progress Notes (Signed)
ANTIBIOTIC CONSULT NOTE  Pharmacy Consult for Vancomycin and Zosyn Indication: suspected osteomyelitis  No Known Allergies  Patient Measurements: Height: 6\' 5"  (195.6 cm) Weight: 221 lb 12.8 oz (100.608 kg) IBW/kg (Calculated) : 89.1  Vital Signs: Temp: 97.4 F (36.3 C) (06/13 0500) Temp src: Oral (06/13 0500) BP: 163/89 mmHg (06/13 0500) Pulse Rate: 92 (06/13 0500) Intake/Output from previous day: 06/12 0701 - 06/13 0700 In: 1590 [P.O.:840; IV Piggyback:750] Out: 2800 [Urine:2800] Intake/Output from this shift: Total I/O In: -  Out: 1125 [Urine:1125]  Labs:  Recent Labs  04/29/13 1043 04/30/13 0555 05/01/13 0516  WBC 6.3 5.0  --   HGB 12.0* 11.2*  --   PLT 324 264  --   CREATININE 0.76 0.79 0.89   Estimated Creatinine Clearance: 107.1 ml/min (by C-G formula based on Cr of 0.89).  Recent Labs  05/01/13 1401  VANCOTROUGH 16.7     Microbiology: Recent Results (from the past 720 hour(s))  CULTURE, BLOOD (ROUTINE X 2)     Status: None   Collection Time    04/29/13 10:43 AM      Result Value Range Status   Specimen Description BLOOD RIGHT ANTECUBITAL   Final   Special Requests BOTTLES DRAWN AEROBIC AND ANAEROBIC 6CC   Final   Culture NO GROWTH 1 DAY   Final   Report Status PENDING   Incomplete  CULTURE, BLOOD (ROUTINE X 2)     Status: None   Collection Time    04/29/13 10:57 AM      Result Value Range Status   Specimen Description BLOOD LEFT ANTECUBITAL   Final   Special Requests BOTTLES DRAWN AEROBIC AND ANAEROBIC 6CC   Final   Culture NO GROWTH 1 DAY   Final   Report Status PENDING   Incomplete    Medical History: Past Medical History  Diagnosis Date  . Hypertension   . Diabetes mellitus   . Stroke   . Seizures   . PVD (peripheral vascular disease)     s/p bilateral bipass 5/8  . Chronic neck pain     s/p cervical fusion  . Anxiety   . GERD (gastroesophageal reflux disease)    Medications:  Scheduled:  . ALPRAZolam  1 mg Oral BID  .  docusate sodium  100 mg Oral BID  . enoxaparin (LOVENOX) injection  40 mg Subcutaneous Q24H  . hydrochlorothiazide  12.5 mg Oral Daily  . insulin aspart  0-15 Units Subcutaneous TID WC  . insulin aspart  3 Units Subcutaneous TID WC  . levETIRAcetam  500 mg Oral Q breakfast   And  . levETIRAcetam  250 mg Oral BID AC  . lisinopril  10 mg Oral Daily  . pantoprazole  40 mg Oral Daily  . piperacillin-tazobactam (ZOSYN)  IV  3.375 g Intravenous Q8H  . vancomycin  1,000 mg Intravenous Q8H   Assessment: 62yo male with h/o left great toe amputation in May of this year.  Admitted with c/o wound infection and increased purulent drainage.  Pt is obese with good renal fxn.  Estimated Creatinine Clearance: 107.1 ml/min (by C-G formula based on Cr of 0.89). Vancomycin trough at goal (16.7)   Goal of Therapy:  Vancomycin trough level 15-20 mcg/ml  Plan: Continue Vancomycin 1000mg  IV every 8 hours. Continue Zosyn 3.375 gm IV every 8 hours to be infused over 4 hours Monitor labs, renal fxn, and cultures Duration of therapy per MD  Raquel James, Rachard Isidro Willeen Cass 05/01/2013,3:05 PM

## 2013-05-02 LAB — GLUCOSE, CAPILLARY
Glucose-Capillary: 95 mg/dL (ref 70–99)
Glucose-Capillary: 95 mg/dL (ref 70–99)

## 2013-05-02 LAB — CBC
MCH: 24.1 pg — ABNORMAL LOW (ref 26.0–34.0)
MCHC: 31.3 g/dL (ref 30.0–36.0)
Platelets: 290 10*3/uL (ref 150–400)
RBC: 4.77 MIL/uL (ref 4.22–5.81)
RDW: 16.1 % — ABNORMAL HIGH (ref 11.5–15.5)

## 2013-05-02 NOTE — Progress Notes (Signed)
Received call from [facility] in [place] that patient had a bed at facility.  Made MD aware.  MD ordered to transfer patient to facility via [facility].  [facility] notified of transfer.  Patient and patient's wife notified of bed availability.  Schonewitz, Candelaria Stagers 05/02/2013

## 2013-05-02 NOTE — Progress Notes (Signed)
Called report to Little Flock, Charity fundraiser at [facility] in [place].  Asked appropriate questions and verbalized understanding.  Pt transferred to [facility] in [place] via [place]. Schonewitz, Candelaria Stagers 05/02/2013

## 2013-05-02 NOTE — Progress Notes (Signed)
  Ruben Lloyd AVW:[id number] DOB: 1951/04/29 DOA: 04/29/2013 PCP: Provider Not In System   Subjective: The patient feels that his left foot is improving and he can have some feeling in it now. There has been no fever.           Physical Exam: Blood pressure 133/77, pulse 84, temperature 97.8 F (36.6 C), temperature source Oral, resp. rate 20, height 6\' 5"  (1.956 m), weight 100.608 kg (221 lb 12.8 oz), SpO2 95.00%. Heart sounds are present and normal. He does not look toxic. His left leg is warm as are his toes. He is alert and orientated.   Investigations:  Recent Results (from the past 240 hour(s))  CULTURE, BLOOD (ROUTINE X 2)     Status: None   Collection Time    04/29/13 10:43 AM      Result Value Range Status   Specimen Description BLOOD RIGHT ANTECUBITAL   Final   Special Requests BOTTLES DRAWN AEROBIC AND ANAEROBIC 6CC   Final   Culture NO GROWTH 1 DAY   Final   Report Status PENDING   Incomplete  CULTURE, BLOOD (ROUTINE X 2)     Status: None   Collection Time    04/29/13 10:57 AM      Result Value Range Status   Specimen Description BLOOD LEFT ANTECUBITAL   Final   Special Requests BOTTLES DRAWN AEROBIC AND ANAEROBIC 6CC   Final   Culture NO GROWTH 1 DAY   Final   Report Status PENDING   Incomplete     Basic Metabolic Panel:  Recent Labs  82/95/62 0555 05/01/13 0516  NA 138 138  K 4.0 4.0  CL 103 100  CO2 27 29  GLUCOSE 96 99  BUN 9 8  CREATININE 0.79 0.89  CALCIUM 9.3 9.9   Liver Function Tests: No results found for this basename: AST, ALT, ALKPHOS, BILITOT, PROT, ALBUMIN,  in the last 72 hours   CBC:  Recent Labs  04/29/13 1043 04/30/13 0555 05/02/13 0437  WBC 6.3 5.0 5.3  NEUTROABS 4.1  --   --   HGB 12.0* 11.2* 11.5*  HCT 37.8* 34.8* 36.7*  MCV 77.1* 76.7* 76.9*  PLT 324 264 290    No results found.    Medications: I have reviewed the patient's current medications.  Impression: 1. Osteomyelitis of the left foot. 2. Left  femoral-femoral bypass graft occlusion. 3. Hypertension. 2. Type 2 diabetes mellitus. 5. Tobacco abuse.     Plan: 1. Continue with intravenous antibiotics. 2. I will speak with vascular surgeon in [place] today to see if he needs to be transferred urgently or we can wait for the [facility] for transfer.  Consultants:  Surgery, Dr. Lovell Sheehan.   Procedures:  None.   Antibiotics:  Intravenous Zosyn started on 04/29/2013.  Intravenous vancomycin started on 04/29/2013.                   Code Status: Full code.  Family Communication: Discussed plan with patient at the bedside.   Disposition Plan: Awaiting transfer to the [place].  Time spent: 20 minutes.   LOS: 3 days   Wilson Singer Pager [phone]  05/02/2013, 8:26 AM

## 2013-05-04 LAB — CULTURE, BLOOD (ROUTINE X 2)
Culture: NO GROWTH
Culture: NO GROWTH

## 2014-01-07 ENCOUNTER — Emergency Department (HOSPITAL_COMMUNITY)
Admission: EM | Admit: 2014-01-07 | Discharge: 2014-01-07 | Disposition: A | Discharge status: Home / Self Care | Payer: Non-veteran care | Attending: Emergency Medicine | Admitting: Emergency Medicine

## 2014-01-07 ENCOUNTER — Encounter (HOSPITAL_COMMUNITY): Payer: Self-pay | Admitting: Emergency Medicine

## 2014-01-07 ENCOUNTER — Emergency Department (HOSPITAL_COMMUNITY): Payer: Non-veteran care

## 2014-01-07 DIAGNOSIS — Z8673 Personal history of transient ischemic attack (TIA), and cerebral infarction without residual deficits: Secondary | ICD-10-CM | POA: Insufficient documentation

## 2014-01-07 DIAGNOSIS — K219 Gastro-esophageal reflux disease without esophagitis: Secondary | ICD-10-CM | POA: Insufficient documentation

## 2014-01-07 DIAGNOSIS — G8929 Other chronic pain: Secondary | ICD-10-CM | POA: Insufficient documentation

## 2014-01-07 DIAGNOSIS — F17200 Nicotine dependence, unspecified, uncomplicated: Secondary | ICD-10-CM | POA: Insufficient documentation

## 2014-01-07 DIAGNOSIS — G40909 Epilepsy, unspecified, not intractable, without status epilepticus: Secondary | ICD-10-CM | POA: Insufficient documentation

## 2014-01-07 DIAGNOSIS — E119 Type 2 diabetes mellitus without complications: Secondary | ICD-10-CM | POA: Insufficient documentation

## 2014-01-07 DIAGNOSIS — R142 Eructation: Secondary | ICD-10-CM | POA: Insufficient documentation

## 2014-01-07 DIAGNOSIS — Z79899 Other long term (current) drug therapy: Secondary | ICD-10-CM | POA: Insufficient documentation

## 2014-01-07 DIAGNOSIS — R062 Wheezing: Secondary | ICD-10-CM | POA: Insufficient documentation

## 2014-01-07 DIAGNOSIS — R141 Gas pain: Secondary | ICD-10-CM | POA: Insufficient documentation

## 2014-01-07 DIAGNOSIS — R143 Flatulence: Secondary | ICD-10-CM

## 2014-01-07 DIAGNOSIS — R21 Rash and other nonspecific skin eruption: Secondary | ICD-10-CM | POA: Insufficient documentation

## 2014-01-07 DIAGNOSIS — F411 Generalized anxiety disorder: Secondary | ICD-10-CM | POA: Insufficient documentation

## 2014-01-07 DIAGNOSIS — I10 Essential (primary) hypertension: Secondary | ICD-10-CM | POA: Insufficient documentation

## 2014-01-07 LAB — COMPREHENSIVE METABOLIC PANEL
ALK PHOS: 104 U/L (ref 39–117)
ALT: 7 U/L (ref 0–53)
AST: 16 U/L (ref 0–37)
Albumin: 4.1 g/dL (ref 3.5–5.2)
BUN: 10 mg/dL (ref 6–23)
CALCIUM: 9.8 mg/dL (ref 8.4–10.5)
CO2: 25 meq/L (ref 19–32)
Chloride: 95 mEq/L — ABNORMAL LOW (ref 96–112)
Creatinine, Ser: 0.84 mg/dL (ref 0.50–1.35)
GLUCOSE: 132 mg/dL — AB (ref 70–99)
POTASSIUM: 4.2 meq/L (ref 3.7–5.3)
Sodium: 134 mEq/L — ABNORMAL LOW (ref 137–147)
TOTAL PROTEIN: 8.7 g/dL — AB (ref 6.0–8.3)
Total Bilirubin: 0.3 mg/dL (ref 0.3–1.2)

## 2014-01-07 LAB — CBC WITH DIFFERENTIAL/PLATELET
Basophils Absolute: 0 10*3/uL (ref 0.0–0.1)
Basophils Relative: 1 % (ref 0–1)
EOS ABS: 0.1 10*3/uL (ref 0.0–0.7)
EOS PCT: 2 % (ref 0–5)
HCT: 40.9 % (ref 39.0–52.0)
HEMOGLOBIN: 13 g/dL (ref 13.0–17.0)
LYMPHS ABS: 1 10*3/uL (ref 0.7–4.0)
Lymphocytes Relative: 28 % (ref 12–46)
MCH: 24.3 pg — AB (ref 26.0–34.0)
MCHC: 31.8 g/dL (ref 30.0–36.0)
MCV: 76.4 fL — AB (ref 78.0–100.0)
MONOS PCT: 9 % (ref 3–12)
Monocytes Absolute: 0.3 10*3/uL (ref 0.1–1.0)
Neutro Abs: 2.2 10*3/uL (ref 1.7–7.7)
Neutrophils Relative %: 60 % (ref 43–77)
Platelets: 281 10*3/uL (ref 150–400)
RBC: 5.35 MIL/uL (ref 4.22–5.81)
RDW: 15.1 % (ref 11.5–15.5)
WBC: 3.7 10*3/uL — ABNORMAL LOW (ref 4.0–10.5)

## 2014-01-07 LAB — LIPASE, BLOOD: LIPASE: 30 U/L (ref 11–59)

## 2014-01-07 LAB — TROPONIN I
Troponin I: <0.3 ng/mL (ref <0.30)
Troponin I: <0.3 ng/mL (ref <0.30)

## 2014-01-07 MED ORDER — OXYCODONE-ACETAMINOPHEN 5-325 MG PO TABS: 1.0000 | ORAL_TABLET | Freq: Four times a day (QID) | ORAL | Status: DC | PRN

## 2014-01-07 MED ORDER — OXYCODONE-ACETAMINOPHEN 5-325 MG PO TABS
1.0000 | ORAL_TABLET | Freq: Once | ORAL | Status: AC
  Administered 2014-01-07: 1 via ORAL
  Filled 2014-01-07: qty 1

## 2014-01-07 MED ORDER — GI COCKTAIL ~~LOC~~
30.0000 mL | Freq: Once | ORAL | Status: AC
  Administered 2014-01-07: 30 mL via ORAL
  Filled 2014-01-07: qty 30

## 2014-01-07 NOTE — ED Provider Notes (Signed)
CSN: [id number]     Arrival date & time 01/07/14  [id number] History  This chart was scribed for Maudry Diego, MD by Ludger Nutting, ED Scribe. This patient was seen in room APA14/APA14 and the patient's care was started 9:54 AM.    Chief Complaint  Patient presents with  . Chest Pain     Patient is a 63 y.o. male presenting with chest pain. The history is provided by the patient. No language interpreter was used.  Chest Pain Pain location:  Epigastric Pain quality: burning   Pain radiates to:  Does not radiate Pain radiates to the back: no   Pain severity:  Mild Onset quality:  Gradual Duration:  3 months Timing:  Intermittent Progression:  Unchanged Chronicity:  New Relieved by:  Nothing Worsened by:  Nothing tried Associated symptoms: heartburn   Associated symptoms: no abdominal pain, no back pain, no fatigue, no nausea, no shortness of breath and not vomiting   Risk factors: diabetes mellitus, hypertension and smoking     HPI Comments: Ruben Lloyd is a 63 y.o. male with past medical history of HTN, DM, CVA, peripheral vascular disease who presents to the Emergency Department complaining of ongoing, intermittent, unchanged chest pain that began 3-4 months ago. Pt describes this pain as indigestion and states the pain is mild. He has associated abdominal bloating and gas. He denies prior MI.   He also complains of a red area to the left chest that he noticed this morning. He is unsure when this redness developed. Pt denies associated itching or pain.   Past Medical History  Diagnosis Date  . Hypertension   . Diabetes mellitus   . Stroke   . Seizures   . PVD (peripheral vascular disease)     s/p bilateral bipass 5/8  . Chronic neck pain     s/p cervical fusion  . Anxiety   . GERD (gastroesophageal reflux disease)    Past Surgical History  Procedure Laterality Date  . Cholecystectomy    . Neck surgery    . Toe amputation    . Femoral-popliteal bypass graft     History  reviewed. No pertinent family history. History  Substance Use Topics  . Smoking status: Current Every Day Smoker    Types: Cigarettes  . Smokeless tobacco: Not on file  . Alcohol Use: No    Review of Systems  Constitutional: Negative for appetite change and fatigue.  HENT: Negative for congestion, ear discharge and sinus pressure.   Respiratory: Negative for shortness of breath.   Cardiovascular: Positive for chest pain.  Gastrointestinal: Positive for heartburn. Negative for nausea, vomiting, abdominal pain and diarrhea.  Genitourinary: Negative for frequency and hematuria.  Musculoskeletal: Negative for back pain.  Skin: Positive for rash.  Neurological: Negative for seizures.  Psychiatric/Behavioral: Negative for hallucinations.      Allergies  Review of patient's allergies indicates no known allergies.  Home Medications   Current Outpatient Rx  Name  Route  Sig  Dispense  Refill  . albuterol (PROVENTIL HFA;VENTOLIN HFA) 108 (90 BASE) MCG/ACT inhaler   Inhalation   Inhale 2 puffs into the lungs every 6 (six) hours as needed. For shortness of breath         . ALPRAZolam (XANAX) 1 MG tablet   Oral   Take 1 mg by mouth 3 (three) times daily.         Marland Kitchen docusate sodium (COLACE) 100 MG capsule   Oral   Take 100 mg  by mouth daily as needed. Constipation         . gabapentin (NEURONTIN) 300 MG capsule   Oral   Take 600 mg by mouth 2 (two) times daily.          . hydrochlorothiazide (HYDRODIURIL) 25 MG tablet   Oral   Take 12.5 mg by mouth daily. For Blood Pressure         . HYDROcodone-acetaminophen (NORCO/VICODIN) 5-325 MG per tablet   Oral   Take 1-2 tablets by mouth every 4 (four) hours as needed for pain.         Marland Kitchen ibuprofen (ADVIL,MOTRIN) 800 MG tablet   Oral   Take 800 mg by mouth 2 (two) times daily as needed. Pain         . levETIRAcetam (KEPPRA) 250 MG tablet   Oral   Take 250-500 mg by mouth 3 (three) times daily. 2 in the morning, 1 at  lunch, and 1 at bedtime         . lisinopril (PRINIVIL,ZESTRIL) 20 MG tablet   Oral   Take 10 mg by mouth daily.          Marland Kitchen omeprazole (PRILOSEC) 20 MG capsule   Oral   Take 20 mg by mouth 2 (two) times daily. To control stomach acid         . oxyCODONE (OXY IR/ROXICODONE) 5 MG immediate release tablet   Oral   Take 5 mg by mouth every 4 (four) hours as needed for moderate pain or severe pain.          BP 132/72  Pulse 110  Temp(Src) 98.5 F (36.9 C) (Oral)  Resp 20  Ht 6\' 5"  (1.956 m)  Wt 240 lb (108.863 kg)  BMI 28.45 kg/m2  SpO2 100% Physical Exam  Nursing note and vitals reviewed. Constitutional: He is oriented to person, place, and time. He appears well-developed.  HENT:  Head: Normocephalic.  Eyes: Conjunctivae and EOM are normal. No scleral icterus.  Neck: Neck supple. No thyromegaly present.  Cardiovascular: Normal rate and regular rhythm.  Exam reveals no gallop and no friction rub.   No murmur heard. Pulmonary/Chest: Effort normal. No stridor. He has wheezes (mild). He has no rales. He exhibits no tenderness.  Circular rash to the left chest  Abdominal: He exhibits no distension. There is no tenderness. There is no rebound.  Musculoskeletal: Normal range of motion. He exhibits no edema.  Lymphadenopathy:    He has no cervical adenopathy.  Neurological: He is oriented to person, place, and time. He exhibits normal muscle tone. Coordination normal.  Skin: No rash noted. No erythema.  Psychiatric: He has a normal mood and affect. His behavior is normal.    ED Course  Procedures (including critical care time)  DIAGNOSTIC STUDIES: Oxygen Saturation is 100% on RA, normal by my interpretation.    COORDINATION OF CARE: 10:06 AM Discussed treatment plan with pt at bedside and pt agreed to plan.   Labs Review Labs Reviewed  CBC WITH DIFFERENTIAL - Abnormal; Notable for the following:    WBC 3.7 (*)    MCV 76.4 (*)    MCH 24.3 (*)    All other  components within normal limits  COMPREHENSIVE METABOLIC PANEL - Abnormal; Notable for the following:    Sodium 134 (*)    Chloride 95 (*)    Glucose, Bld 132 (*)    Total Protein 8.7 (*)    All other components within normal limits  LIPASE,  BLOOD  TROPONIN I   Imaging Review Dg Abd Acute W/chest  01/07/2014   CLINICAL DATA:  Pain  EXAM: ACUTE ABDOMEN SERIES (ABDOMEN 2 VIEW & CHEST 1 VIEW)  COMPARISON:  Chest radiograph October 06, 2012  FINDINGS: PA chest: There is a degree of underlying emphysematous change. There is no edema or consolidation. The heart size is normal. Pulmonary vascularity reflects underlying emphysema. No adenopathy.  Supine and upright abdomen: There is no bowel dilatation. There are multiple scattered air-fluid levels, however. No free air. There are multiple foci of arterial vascular calcification. There is a 4 mm calculus in the mid left kidney. There is lumbar levoscoliosis with degenerative change in the lumbar spine.  IMPRESSION: Multiple air-fluid levels are noted. This finding suggests enteritis or early ileus. Early partial obstruction is possible but felt to be somewhat less likely. No free air.  4 mm calculus mid left kidney.  Underlying emphysema.  No lung edema or consolidation.   Electronically Signed   By: Lowella Grip M.D.   On: 01/07/2014 10:56    EKG Interpretation    Date/Time:  Thursday January 07 2014 10:31:03 EST Ventricular Rate:  102 PR Interval:  148 QRS Duration: 140 QT Interval:  390 QTC Calculation: 508 R Axis:   121 Text Interpretation:  Sinus tachycardia Possible Left atrial enlargement Right bundle branch block Abnormal ECG When compared with ECG of 23-May-2012 14:24, QRS axis Shifted right Confirmed by Christeen Lai  MD, Kramer Hanrahan ([id number]) on 01/07/2014 1:47:10 PM            MDM   Final diagnoses:  None    Gerd,  Pt improved with tx.  wil see pcp in 2 weeks.  Will increase prilosec and stop motrin The chart was scribed for me  under my direct supervision.  I personally performed the history, physical, and medical decision making and all procedures in the evaluation of this patient.Maudry Diego, MD 01/07/14 [phone]

## 2014-01-07 NOTE — ED Notes (Signed)
Pt states pain is epigastric, cough x 1-2 months, productive and clear.

## 2014-01-07 NOTE — Discharge Instructions (Signed)
Increase the prilosec to twice a day.  Stop the motrin.  Follow up with your md as planned

## 2014-01-07 NOTE — ED Notes (Signed)
Pt c/o congested cough-clear sputum x 1.5 months. Pt also c/o indigestion/bloating and constipation x 1 week. lnbm yesterday.

## 2014-02-19 HISTORY — PX: GASTROSTOMY TUBE PLACEMENT: SHX655

## 2014-03-15 ENCOUNTER — Encounter (HOSPITAL_COMMUNITY): Payer: Self-pay | Admitting: Emergency Medicine

## 2014-03-15 ENCOUNTER — Emergency Department (HOSPITAL_COMMUNITY): Payer: Non-veteran care

## 2014-03-15 ENCOUNTER — Emergency Department (HOSPITAL_COMMUNITY)
Admission: EM | Admit: 2014-03-15 | Discharge: 2014-03-15 | Disposition: A | Discharge status: Home / Self Care | Payer: Non-veteran care | Attending: Emergency Medicine | Admitting: Emergency Medicine

## 2014-03-15 DIAGNOSIS — K219 Gastro-esophageal reflux disease without esophagitis: Secondary | ICD-10-CM | POA: Insufficient documentation

## 2014-03-15 DIAGNOSIS — F17200 Nicotine dependence, unspecified, uncomplicated: Secondary | ICD-10-CM | POA: Insufficient documentation

## 2014-03-15 DIAGNOSIS — K5900 Constipation, unspecified: Secondary | ICD-10-CM | POA: Insufficient documentation

## 2014-03-15 DIAGNOSIS — E119 Type 2 diabetes mellitus without complications: Secondary | ICD-10-CM | POA: Insufficient documentation

## 2014-03-15 DIAGNOSIS — Z9089 Acquired absence of other organs: Secondary | ICD-10-CM | POA: Insufficient documentation

## 2014-03-15 DIAGNOSIS — Z8673 Personal history of transient ischemic attack (TIA), and cerebral infarction without residual deficits: Secondary | ICD-10-CM | POA: Insufficient documentation

## 2014-03-15 DIAGNOSIS — E860 Dehydration: Secondary | ICD-10-CM | POA: Insufficient documentation

## 2014-03-15 DIAGNOSIS — C159 Malignant neoplasm of esophagus, unspecified: Secondary | ICD-10-CM | POA: Insufficient documentation

## 2014-03-15 DIAGNOSIS — Z79899 Other long term (current) drug therapy: Secondary | ICD-10-CM | POA: Insufficient documentation

## 2014-03-15 DIAGNOSIS — Z9889 Other specified postprocedural states: Secondary | ICD-10-CM | POA: Insufficient documentation

## 2014-03-15 DIAGNOSIS — M542 Cervicalgia: Secondary | ICD-10-CM | POA: Insufficient documentation

## 2014-03-15 DIAGNOSIS — F411 Generalized anxiety disorder: Secondary | ICD-10-CM | POA: Insufficient documentation

## 2014-03-15 DIAGNOSIS — G40909 Epilepsy, unspecified, not intractable, without status epilepticus: Secondary | ICD-10-CM | POA: Insufficient documentation

## 2014-03-15 DIAGNOSIS — I10 Essential (primary) hypertension: Secondary | ICD-10-CM | POA: Insufficient documentation

## 2014-03-15 DIAGNOSIS — G8929 Other chronic pain: Secondary | ICD-10-CM | POA: Insufficient documentation

## 2014-03-15 LAB — CBC WITH DIFFERENTIAL/PLATELET
BASOS ABS: 0 10*3/uL (ref 0.0–0.1)
BASOS PCT: 0 % (ref 0–1)
EOS ABS: 0 10*3/uL (ref 0.0–0.7)
EOS PCT: 0 % (ref 0–5)
HCT: 38.1 % — ABNORMAL LOW (ref 39.0–52.0)
Hemoglobin: 12.6 g/dL — ABNORMAL LOW (ref 13.0–17.0)
LYMPHS PCT: 13 % (ref 12–46)
Lymphs Abs: 0.6 10*3/uL — ABNORMAL LOW (ref 0.7–4.0)
MCH: 25 pg — ABNORMAL LOW (ref 26.0–34.0)
MCHC: 33.1 g/dL (ref 30.0–36.0)
MCV: 75.6 fL — AB (ref 78.0–100.0)
MONO ABS: 0.2 10*3/uL (ref 0.1–1.0)
Monocytes Relative: 4 % (ref 3–12)
Neutro Abs: 3.7 10*3/uL (ref 1.7–7.7)
Neutrophils Relative %: 83 % — ABNORMAL HIGH (ref 43–77)
PLATELETS: 181 10*3/uL (ref 150–400)
RBC: 5.04 MIL/uL (ref 4.22–5.81)
RDW: 16.2 % — AB (ref 11.5–15.5)
WBC: 4.5 10*3/uL (ref 4.0–10.5)

## 2014-03-15 LAB — BASIC METABOLIC PANEL
BUN: 28 mg/dL — ABNORMAL HIGH (ref 6–23)
CALCIUM: 10.7 mg/dL — AB (ref 8.4–10.5)
CO2: 26 meq/L (ref 19–32)
CREATININE: 0.97 mg/dL (ref 0.50–1.35)
Chloride: 95 mEq/L — ABNORMAL LOW (ref 96–112)
GFR, EST NON AFRICAN AMERICAN: 85 mL/min — AB (ref >90)
Glucose, Bld: 127 mg/dL — ABNORMAL HIGH (ref 70–99)
Potassium: 4.7 mEq/L (ref 3.7–5.3)
SODIUM: 139 meq/L (ref 137–147)

## 2014-03-15 MED ORDER — SODIUM CHLORIDE 0.9 % IV BOLUS (SEPSIS)
1000.0000 mL | Freq: Once | INTRAVENOUS | Status: AC
  Administered 2014-03-15: 1000 mL via INTRAVENOUS

## 2014-03-15 MED ORDER — ONDANSETRON HCL 4 MG/5ML PO SOLN: 8.0000 mg | Freq: Once | ORAL | Status: DC

## 2014-03-15 NOTE — ED Notes (Signed)
Pt here for dehydration/constipation x 2 weeks. 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 Pt has feeding tube and states he was given to much feeding at one time.

## 2014-03-15 NOTE — ED Notes (Signed)
Patient has had little bowel movement after enema.

## 2014-03-15 NOTE — Discharge Instructions (Signed)
Increase fluids.  Recommend Fleets enema once daily until you have a bowel movement. Rollover with your left side down and have your wife administers the solution slowly into your rectum.  Liquid medication for nausea. You can administer this through your tube

## 2014-03-15 NOTE — ED Notes (Signed)
PT reported he had a BM in the waiting room restroom earlier.

## 2014-03-15 NOTE — ED Provider Notes (Signed)
CSN: [id number]     Arrival date & time 03/15/14  1113 History   First MD Initiated Contact with Patient 03/15/14 1631     Chief Complaint  Patient presents with  . Constipation     (Consider location/radiation/quality/duration/timing/severity/associated sxs/prior Treatment) HPI.... patient has recent diagnosis of esophageal cancer with subsequent feeding tube placement in the [place] system.   He now feels dehydrated and constipated. No good bowel movement for several days. No vomiting, fever, chills. Severity is mild to moderate. Nothing makes symptoms better or worse  Past Medical History  Diagnosis Date  . Hypertension   . Diabetes mellitus   . Stroke   . Seizures   . PVD (peripheral vascular disease)     s/p bilateral bipass 5/8  . Chronic neck pain     s/p cervical fusion  . Anxiety   . GERD (gastroesophageal reflux disease)    Past Surgical History  Procedure Laterality Date  . Cholecystectomy    . Neck surgery    . Toe amputation    . Femoral-popliteal bypass graft     No family history on file. History  Substance Use Topics  . Smoking status: Current Every Day Smoker -- 0.50 packs/day    Types: Cigarettes  . Smokeless tobacco: Not on file  . Alcohol Use: No    Review of Systems  All other systems reviewed and are negative.     Allergies  Review of patient's allergies indicates no known allergies.  Home Medications   Prior to Admission medications   Medication Sig Start Date End Date Taking? Authorizing Provider  albuterol (PROVENTIL HFA;VENTOLIN HFA) 108 (90 BASE) MCG/ACT inhaler Inhale 2 puffs into the lungs every 6 (six) hours as needed. For shortness of breath   Yes Historical Provider, MD  gabapentin (NEURONTIN) 300 MG capsule Take 600 mg by mouth 2 (two) times daily.    Yes Historical Provider, MD  HYDROcodone-acetaminophen (NORCO/VICODIN) 5-325 MG per tablet Take 1-2 tablets by mouth every 4 (four) hours as needed for pain.   Yes Historical Provider,  MD  levETIRAcetam (KEPPRA) 250 MG tablet Take 250-500 mg by mouth 3 (three) times daily. 2 in the morning, 1 at lunch, and 1 at bedtime   Yes Historical Provider, MD  morphine 10 MG/5ML solution Take 10 mg by mouth every 6 (six) hours as needed for moderate pain or severe pain.   Yes Historical Provider, MD  omeprazole (PRILOSEC) 20 MG capsule Take 20 mg by mouth 2 (two) times daily. To control stomach acid   Yes Historical Provider, MD  oxyCODONE (OXY IR/ROXICODONE) 5 MG immediate release tablet Take 5 mg by mouth every 4 (four) hours as needed for moderate pain or severe pain.   Yes Historical Provider, MD  docusate sodium (COLACE) 100 MG capsule Take 100 mg by mouth daily as needed. Constipation    Historical Provider, MD  hydrochlorothiazide (HYDRODIURIL) 25 MG tablet Take 12.5 mg by mouth daily. For Blood Pressure    Historical Provider, MD  ondansetron (ZOFRAN) 4 MG/5ML solution Take 10 mLs (8 mg total) by mouth once. 03/15/14   Nat Christen, MD   BP 127/75  Pulse 54  Temp(Src) 97.7 F (36.5 C) (Oral)  Resp 16  Ht 6\' 5"  (1.956 m)  Wt 197 lb (89.359 kg)  BMI 23.36 kg/m2  SpO2 98% Physical Exam  Nursing note and vitals reviewed. Constitutional: He is oriented to person, place, and time. He appears well-developed and well-nourished.  HENT:  Head: Normocephalic and  atraumatic.  Eyes: Conjunctivae and EOM are normal. Pupils are equal, round, and reactive to light.  Neck: Normal range of motion. Neck supple.  Cardiovascular: Normal rate, regular rhythm and normal heart sounds.   Pulmonary/Chest: Effort normal and breath sounds normal.  Abdominal: Soft. Bowel sounds are normal.  Abdomen soft. Feeding tube in place.  Genitourinary:  Rectal exam reveals firm stool bolus in the rectum  Musculoskeletal: Normal range of motion.  Neurological: He is alert and oriented to person, place, and time.  Skin: Skin is warm and dry.  Psychiatric: He has a normal mood and affect. His behavior is  normal.    ED Course  Fecal disimpaction Date/Time: 03/15/2014 8:00 PM Performed by: Nat Christen Authorized by: Nat Christen Consent: Verbal consent obtained. Risks and benefits: risks, benefits and alternatives were discussed Consent given by: patient Patient understanding: patient states understanding of the procedure being performed Patient consent: the patient's understanding of the procedure matches consent given Imaging studies: imaging studies available Patient identity confirmed: verbally with patient Time out: Immediately prior to procedure a "time out" was called to verify the correct patient, procedure, equipment, support staff and site/side marked as required. Preparation: Patient was prepped and draped in the usual sterile fashion. Local anesthesia used: no Patient sedated: no Patient tolerance: Patient tolerated the procedure well with no immediate complications. Comments: Digital disimpaction performed with minimal extraction stool. Fleets enema administered.   (including critical care time) Labs Review Labs Reviewed  CBC WITH DIFFERENTIAL - Abnormal; Notable for the following:    Hemoglobin 12.6 (*)    HCT 38.1 (*)    MCV 75.6 (*)    MCH 25.0 (*)    RDW 16.2 (*)    Neutrophils Relative % 83 (*)    Lymphs Abs 0.6 (*)    All other components within normal limits  BASIC METABOLIC PANEL - Abnormal; Notable for the following:    Chloride 95 (*)    Glucose, Bld 127 (*)    BUN 28 (*)    Calcium 10.7 (*)    GFR calc non Af Amer 85 (*)    All other components within normal limits    Imaging Review Dg Abd 2 Views  03/15/2014   CLINICAL DATA:  Constipation.  Feeding tube in place.  EXAM: ABDOMEN - 2 VIEW  COMPARISON:  Prior radiograph from 01/07/2014.  FINDINGS: Percutaneous gastrostomy tube is in place. Visualized bowel gas pattern is nonobstructive. Moderate amount of retained stool present within the colon, suggesting constipation. No free intraperitoneal air.  Scattered surgical clips overlie the upper abdomen. No soft tissue mass or abnormal calcification.  The visualized lung bases are clear.  Levoscoliosis with associated multilevel degenerative changes noted within the lower spine. No acute osseous abnormality.  IMPRESSION: 1. Nonobstructive bowel gas pattern. Percutaneous gastrostomy tube in place. 2. Moderate amount of retained stool within the colon, suggesting constipation.   Electronically Signed   By: Jeannine Boga M.D.   On: 03/15/2014 12:34     EKG Interpretation None      MDM   Final diagnoses:  Dehydration  Constipation  Esophageal cancer    Patient feels much better after 2 L of IV fluids. Digital disimpaction along with fleets enema administered. No significant results. Instructed wife how to perform enema at home.    Nat Christen, MD 03/15/14 2116

## 2014-04-23 ENCOUNTER — Encounter (HOSPITAL_COMMUNITY): Payer: Self-pay | Admitting: Emergency Medicine

## 2014-04-23 ENCOUNTER — Emergency Department (HOSPITAL_COMMUNITY)
Admission: EM | Admit: 2014-04-23 | Discharge: 2014-04-23 | Disposition: A | Discharge status: Home / Self Care | Payer: Non-veteran care | Attending: Emergency Medicine | Admitting: Emergency Medicine

## 2014-04-23 ENCOUNTER — Emergency Department (HOSPITAL_COMMUNITY): Payer: Non-veteran care

## 2014-04-23 DIAGNOSIS — G40909 Epilepsy, unspecified, not intractable, without status epilepticus: Secondary | ICD-10-CM | POA: Insufficient documentation

## 2014-04-23 DIAGNOSIS — K9420 Gastrostomy complication, unspecified: Secondary | ICD-10-CM

## 2014-04-23 DIAGNOSIS — K9423 Gastrostomy malfunction: Secondary | ICD-10-CM | POA: Insufficient documentation

## 2014-04-23 DIAGNOSIS — K219 Gastro-esophageal reflux disease without esophagitis: Secondary | ICD-10-CM | POA: Insufficient documentation

## 2014-04-23 DIAGNOSIS — G8929 Other chronic pain: Secondary | ICD-10-CM | POA: Insufficient documentation

## 2014-04-23 DIAGNOSIS — I10 Essential (primary) hypertension: Secondary | ICD-10-CM | POA: Insufficient documentation

## 2014-04-23 DIAGNOSIS — F17200 Nicotine dependence, unspecified, uncomplicated: Secondary | ICD-10-CM | POA: Insufficient documentation

## 2014-04-23 DIAGNOSIS — F411 Generalized anxiety disorder: Secondary | ICD-10-CM | POA: Insufficient documentation

## 2014-04-23 DIAGNOSIS — Z79899 Other long term (current) drug therapy: Secondary | ICD-10-CM | POA: Insufficient documentation

## 2014-04-23 DIAGNOSIS — Z8673 Personal history of transient ischemic attack (TIA), and cerebral infarction without residual deficits: Secondary | ICD-10-CM | POA: Insufficient documentation

## 2014-04-23 DIAGNOSIS — E119 Type 2 diabetes mellitus without complications: Secondary | ICD-10-CM | POA: Insufficient documentation

## 2014-04-23 MED ORDER — MORPHINE SULFATE 4 MG/ML IJ SOLN
INTRAMUSCULAR | Status: AC
  Filled 2014-04-23: qty 1

## 2014-04-23 MED ORDER — MORPHINE SULFATE 4 MG/ML IJ SOLN
4.0000 mg | Freq: Once | INTRAMUSCULAR | Status: AC
  Administered 2014-04-23: 4 mg via INTRAMUSCULAR

## 2014-04-23 NOTE — ED Notes (Signed)
Feeding tube came out this morning.  Also having pain at the site of the feeding tube.  Vss.

## 2014-04-23 NOTE — ED Notes (Addendum)
McManus inserted #[id number] foley at bedside without complication. Pt tolerated well. C/o of back pain, which he states is chronic.

## 2014-04-23 NOTE — Discharge Instructions (Signed)
°Emergency Department Resource Guide °1) Find a Doctor and Pay Out of Pocket °Although you won't have to find out who is covered by your insurance plan, it is a good idea to ask around and get recommendations. You will then need to call the office and see if the doctor you have chosen will accept you as a new patient and what types of options they offer for patients who are self-pay. Some doctors offer discounts or will set up payment plans for their patients who do not have insurance, but you will need to ask so you aren't surprised when you get to your appointment. ° °2) Contact Your Local Health Department °Not all health departments have doctors that can see patients for sick visits, but many do, so it is worth a call to see if yours does. If you don't know where your local health department is, you can check in your phone book. The CDC also has a tool to help you locate your state's health department, and many state websites also have listings of all of their local health departments. ° °3) Find a Walk-in Clinic °If your illness is not likely to be very severe or complicated, you may want to try a walk in clinic. These are popping up all over the country in pharmacies, drugstores, and shopping centers. They're usually staffed by nurse practitioners or physician assistants that have been trained to treat common illnesses and complaints. They're usually fairly quick and inexpensive. However, if you have serious medical issues or chronic medical problems, these are probably not your best option. ° °No Primary Care Doctor: °- Call [facility] at  832-8000 - they can help you locate a primary care doctor that  accepts your insurance, provides certain services, etc. °- Physician Referral Service- 1-800-533-3463 ° °Chronic Pain Problems: °Organization         Address  Phone   Notes  °[facility]  (336) [phone] Patients need to be referred by their primary care doctor.  ° °Medication  Assistance: °Organization         Address  Phone   Notes  °[place] [facility] [address] °[place], [place] [address] (336) 641-8030 --Must be a resident of [place] °-- Must have NO insurance coverage whatsoever (no Medicaid/ Medicare, etc.) °-- The pt. MUST have a primary care doctor that directs their care regularly and follows them in the community °  °[facility]  (866) 331-1348   °[employer]  (888) 892-1162   ° °Agencies that provide inexpensive medical care: °Organization         Address  Phone   Notes  °[facility]  (336) 832-8035   °[facility]    (336) 832-7272   °[facility] [address] °[place],  [address] (336) 832-4777   °[facility] [address], °[place] (336) 271-4999   °[facility]    (336) 373-0678   °[facility]    (336) 272-1050   °[facility] ° [address], [place] Phone:  (336) 832-4444, Fax:  (336) 832-4440 Hours of Operation:  9 am - 6 pm, M-F.  Also accepts Medicaid/Medicare and self-pay.  °[facility] ° [address],  Phone: (336) 832-3150, Fax: (336) [phone]. Hours of Operation:  8:30 am - 5:30 pm, M-F.  Also accepts Medicaid and self-pay.  °[facility] [place] [address], [place] Phone: (336) 878-6027   °[facility] [address], [place], [place] (336)723-1848, Ext. 123 Mondays & Thursdays: 7-9 AM.  First 15 patients are seen on a first come, first serve basis. °  ° °Medicaid-accepting [place] Providers: ° °Organization         Address  Phone   Notes  °[facility] [address], [place] (336) 641-2100 Also accepts self-pay patients.  °[facility] [address], [place] ° (336) 856-9996   °[facility] [address], [place]  (336) 288-8857   °[facility] [address] [place] [address], [place] (336) 299-7000   °[facility] [address], [place]  ° (336) 373-1557 Only accepts [place] [facility] Medicaid patients after they have their name applied to their card.  ° °Self-Pay (no insurance) in [place]: ° °Organization         Address  Phone   Notes  °Sickle Cell Patients, [facility] [address], [place] (336) 832-1970   °[facility] [address], [place] (336) 832-4400   °[facility] [place] ° [address],  (336) 992-4800   °[facility]/Dr. Osei-Bonsu ° [address] [place] [address], [place] or [address], [place] (336) [phone] Phone number for both [place] and [place] locations is the same.  °[facility] [address], [place] (336) 299-0000   °[facility] [address] [place] [address], [place] or [address] (336) 852-7530 °(336) 878-2260   °[facility] [address], [place] (336) 350-1642, phone; (336) 294-5005, fax Sees patients 1st and 3rd Saturday of every month.  Must not qualify for public or private insurance (i.e. Medicaid, Medicare, [place] Health Choice, Veterans' Benefits) • Household income should be no more than 200% of the poverty level •The clinic cannot treat you if you are pregnant or think you are pregnant • Sexually transmitted diseases are not treated at the clinic.  ° ° °Dental Care: °Organization         Address  Phone  Notes  °[place] [facility] [address], [place] (336) 641-6152 Accepts children up to age 21 who are enrolled in Medicaid or [place] Health Choice; pregnant women with a Medicaid card; and children who have applied for Medicaid or [place] Health Choice, but were declined, whose parents can pay a reduced fee at time of service.  °[place]  [facility] [place]  [address], [place] (336) 641-7733 Accepts children up to age 21 who are enrolled in Medicaid or [place] Health Choice; pregnant women with a Medicaid card; and children who have applied for Medicaid or [place] Health Choice, but were declined, whose parents can pay a reduced fee at time of service.  °[facility] ° [address], [place] (336) 641-4533 Patients are seen by appointment only. Walk-ins are not accepted. [facility] will see patients 18 years of age and older. °Monday - Tuesday (8am-5pm) °Most Wednesdays (8:30-5pm) °$30 per visit, cash only  °[facility] ° [address], [place] (336) 641-4533 Patients are seen by appointment only. Walk-ins are not accepted. [facility] will see patients 18 years of age and older. °One   Wednesday Evening (Monthly: Volunteer Based).  $30 per visit, cash only  °[facility]  (919) 537-3737 for adults; Children under age 4, call Graduate Pediatric Dentistry at ([phone]) [phone]. Children aged 4-14, please call ([phone]) [phone] to request a pediatric application. ° Dental services are provided in all areas of dental care including fillings, crowns and bridges, complete and partial dentures, implants, gum treatment, root canals, and extractions. Preventive care is also provided. Treatment is provided to both adults and children. °Patients are selected via a lottery and there is often a waiting list. °  °[facility] [address], °[place] ° (336) 763-8833 [email or web address] °  °[facility] [address], [place], [place] (336)723-1848, Ext. 123 Second and Fourth Thursday of each month, opens at 6:30 AM; Clinic ends at 9 AM.  Patients are seen on a first-come first-served basis, and a limited number are seen during each clinic.  ° °[facility] ° [address], [place], [place] (336) [phone]    Eligibility Requirements °You must have lived in [place], [place], or [place] counties for at least the last three months. °  You cannot be eligible for state or federal sponsored healthcare insurance, including Veterans Administration, Medicaid, or Medicare. °  You generally cannot be eligible for healthcare insurance through your employer.  °  How to apply: °Eligibility screenings are held every Tuesday and Wednesday afternoon from 1:00 pm until 4:00 pm. You do not need an appointment for the interview!  °[facility] [address], [place], [place] 336-631-2330   °[facility]  336-342-8273   °[facility]  336-703-3100   °[facility]  336-570-6415   ° °Behavioral Health Resources in the Community: °Intensive Outpatient Programs °Organization         Address  Phone  Notes  °[facility] [address], [place], [place] 336-878-6098   °[facility] [address], [place], [place] 336-832-9800   °[facility] [address], [place], [place] ° 336-882-2125   °[facility] [address],  °[place], [place] 1-800-853-5163 or 336-641-4981   °Substance Abuse Resources °Organization         Address  Phone  Notes  °[facility]  336-882-2125   °[facility]  336-784-9470   °[facility]  336-285-9073   °[facility]  336-845-3988   °Residential & Outpatient Substance Abuse Program  1-800-659-3381   °Psychological Services °Organization         Address  Phone  Notes  °[facility]  336- 832-9600   °[facility]  336- 378-7881   °[facility] [address], [place] 1-800-853-5163 or 336-641-4981   ° °Mobile Crisis Teams °Organization         Address  Phone  Notes  °[facility], Mobile Crisis Care Unit  1-877-626-1772   °Assertive °[facility] ° [address]  [place], [place] 336-834-9664   °Sharon DeEsch [address] °[place] 336-554-5454   ° °Self-Help/Support Groups °Organization         Address  Phone             Notes  °[facility] - variety of support groups  336- 373-1402 Call for more information  °Narcotics Anonymous (NA), [facility] [address], °[place]  2 meetings at this location  ° °  Residential Treatment Programs Organization         Address  Phone  Notes  [facility] [address]    [place]  1-276-880-5021   [facility]  [address], [place] [address], [place], [place]   [place], [place] [phone] Admissions: 8am-3pm M-F  [facility] [place] [address],    [place], [place] 952-841-3244   [facility] [address] [place], [place], [place]   [facility] [address]  [place], [place]   [facility] - Intensive Outpatient [address], [place], [place]   [facility] [place] [address]  [place], [place] 1-(920) 049-3100 or (239)698-7286   [facility] [address] [place], [place] Accepts Medicaid  [facility] [address],  [place] 1-(402)589-3160 Substance Abuse/Addiction Treatment   [facility] Organization         Address  Phone  Notes  [facility]  4753375899   Domenic Schwab, PhD [address] [place], [place]   (218) 371-7543 or 639-126-8471   [facility] [place], [place] (267) 665-1410   [facility] [address], [place], [place] [phone] Insurance/Medicaid/sponsorship through [facility] [address]                                    [place], [place] (585)[phone] [place] [address], [place] [phone]    Dr. Adele Schilder  ([phone]   [facility] Dept. 1) [address] [place] 2) [place] 3)  [address], [place] [phone]  [phone]   [place] (640) 696-2724 or [phone] (After Hours)       Take your usual prescriptions as previously directed.  Call your regular GI doctor at the [facility] today to schedule a follow up appointment within the next 3 days to change your feeding tube.  Return to the Emergency Department immediately sooner if worsening.

## 2014-04-23 NOTE — ED Provider Notes (Signed)
CSN: [id number]     Arrival date & time 04/23/14  7893 History   First MD Initiated Contact with Patient 04/23/14 [phone]     Chief Complaint  Patient presents with  . Feeding tube displacement       HPI Pt was seen at [place]. Per pt and his family, c/o sudden onset and resolution of one episode of "feeding tube fell out" this morning approximate 0800. Pt cannot recall the size of his tube and did not bring the tube with him to the ED. Pt denies N/V/D, no abd pain, no fevers, no drainage or erythema at feeding tube site.    Past Medical History  Diagnosis Date  . Hypertension   . Diabetes mellitus   . Stroke   . Seizures   . PVD (peripheral vascular disease)     s/p bilateral bipass 5/8  . Chronic neck pain     s/p cervical fusion  . Anxiety   . GERD (gastroesophageal reflux disease)    Past Surgical History  Procedure Laterality Date  . Cholecystectomy    . Neck surgery    . Toe amputation    . Femoral-popliteal bypass graft    . Gastrostomy tube placement  02/19/2014    [facility]    History  Substance Use Topics  . Smoking status: Current Every Day Smoker -- 0.50 packs/day    Types: Cigarettes  . Smokeless tobacco: Not on file  . Alcohol Use: No    Review of Systems ROS: Statement: All systems negative except as marked or noted in the HPI; Constitutional: Negative for fever and chills. ; ; Eyes: Negative for eye pain, redness and discharge. ; ; ENMT: Negative for ear pain, hoarseness, nasal congestion, sinus pressure and sore throat. ; ; Cardiovascular: Negative for chest pain, palpitations, diaphoresis, dyspnea and peripheral edema. ; ; Respiratory: Negative for cough, wheezing and stridor. ; ; Gastrointestinal: +feeding tube fell out. Negative for nausea, vomiting, diarrhea, abdominal pain, blood in stool, hematemesis, jaundice and rectal bleeding. . ; ; Genitourinary: Negative for dysuria, flank pain and hematuria. ; ; Musculoskeletal: Negative for back pain and neck pain.  Negative for swelling and trauma.; ; Skin: Negative for pruritus, rash, abrasions, blisters, bruising and skin lesion.; ; Neuro: Negative for headache, lightheadedness and neck stiffness. Negative for weakness, altered level of consciousness , altered mental status, extremity weakness, paresthesias, involuntary movement, seizure and syncope.        Allergies  Review of patient's allergies indicates no known allergies.  Home Medications   Prior to Admission medications   Medication Sig Start Date End Date Taking? Authorizing Provider  gabapentin (NEURONTIN) 300 MG capsule Take 600 mg by mouth 2 (two) times daily.    Yes Historical Provider, MD  HYDROcodone-acetaminophen (NORCO/VICODIN) 5-325 MG per tablet Take 1-2 tablets by mouth every 4 (four) hours as needed for pain.   Yes Historical Provider, MD  levETIRAcetam (KEPPRA) 250 MG tablet Take 250-500 mg by mouth 3 (three) times daily. 2 in the morning, 1 at lunch, and 1 at bedtime   Yes Historical Provider, MD  morphine 10 MG/5ML solution Take 10 mg by mouth every 6 (six) hours as needed for moderate pain or severe pain.   Yes Historical Provider, MD  omeprazole (PRILOSEC) 20 MG capsule Take 20 mg by mouth 2 (two) times daily as needed (acid reflux). To control stomach acid   Yes Historical Provider, MD  oxyCODONE (OXY IR/ROXICODONE) 5 MG immediate release tablet Take 5 mg by  mouth every 4 (four) hours as needed for moderate pain or severe pain.   Yes Historical Provider, MD  polyethylene glycol (MIRALAX / GLYCOLAX) packet Take 17 g by mouth daily as needed for mild constipation.   Yes Historical Provider, MD  albuterol (PROVENTIL HFA;VENTOLIN HFA) 108 (90 BASE) MCG/ACT inhaler Inhale 2 puffs into the lungs every 6 (six) hours as needed. For shortness of breath    Historical Provider, MD   BP 111/69  Pulse 121  Temp(Src) 98.3 F (36.8 C) (Oral)  Resp 16  Ht 6\' 6"  (1.981 m)  Wt 175 lb (79.379 kg)  BMI 20.23 kg/m2  SpO2 92% Physical  Exam 0845: Physical examination:  Nursing notes reviewed; Vital signs and O2 SAT reviewed;  Constitutional: Well developed, Well nourished, Well hydrated, In no acute distress; Head:  Normocephalic, atraumatic; Eyes: EOMI, PERRL, No scleral icterus; ENMT: Mouth and pharynx normal, Mucous membranes moist; Neck: Supple, Full range of motion, No lymphadenopathy; Cardiovascular: Regular rate and rhythm, No gallop; Respiratory: Breath sounds clear & equal bilaterally, No rales, rhonchi, wheezes.  Speaking full sentences with ease, Normal respiratory effort/excursion; Chest: Nontender, Movement normal; Abdomen: +G-tube stoma without drainage, tenderness, or erythema. Soft, Nontender, Nondistended, Normal bowel sounds; Genitourinary: No CVA tenderness; Extremities: Pulses normal, No tenderness, No edema, No calf edema or asymmetry.; Neuro: AA&Ox3, Major CN grossly intact.  Speech clear. No gross focal motor or sensory deficits in extremities.; Skin: Color normal, Warm, Dry.   ED Course  Procedures   0930: Gastrostomy tube replacement Performed by: Alfonzo Feller Consent: Verbal consent obtained. Risks and benefits: risks, benefits and alternatives were discussed Required items: required blood products, implants, devices, and special equipment available Patient identity confirmed: hospital-assigned identification number Time out: Immediately prior to procedure a "time out" was called to verify the correct patient, procedure, equipment, support staff and site/side marked as required. Preparation: Patient was prepped and draped in the usual sterile fashion. Patient tolerance: Patient tolerated the procedure well with no immediate complications.  Comments: 87 french foley placed without difficulty   MDM  MDM Reviewed: previous chart, nursing note and vitals Interpretation: x-ray    Dg Abd 2 Views 04/23/2014   CLINICAL DATA:  Gastrostomy tube replacement  EXAM: ABDOMEN - 2 VIEW  COMPARISON:   03/15/2014  FINDINGS: 40 cc of contrast was injected through the gastrostomy tube. This demonstrates opacification of the stomach. No extravasation.  Scout image demonstrates contrast material within the right colon. No evidence of bowel obstruction or, organomegaly or free air. Prior cholecystectomy.  IMPRESSION: Gastrostomy tube located within the stomach.   Electronically Signed   By: Rolm Baptise M.D.   On: 04/23/2014 10:54    1230:  Both pt and his family do not know what kind of tube he has (G vs J-tube), or the size of the tube. Pt and his family did not bring the feeding tube with them. Pt does not know the name of any of his doctor's at the [place] (PMD or GI). Multiple phone calls to [place] to ascertain more information regarding pt's feeding tube. EDP at [facility] performed medical record search: 102F G-tube was placed on 02/19/14. Multiple calls around [facility] to obtain proper sized G-tube without success. 102F foley catheter placed easily through stoma. XR confirms placement. Pt requesting "some pain meds" for his chronic back pain because "I've missed my dose of a few of them" and then wants to go home. IM morphine given. Pt's VS per his baseline per [facility] chart review.  Pt wants to go home now. Dx and testing d/w pt and family.  Questions answered.  Verb understanding, agreeable to d/c home with outpt f/u.    Alfonzo Feller, DO 04/27/14 [phone]

## 2014-07-20 DEATH — deceased

## 2015-09-13 IMAGING — CR DG ABDOMEN 2V
2 series · 2 of 2 positions shown · non-contrast
Comparison: Prior radiograph from 01/07/2014.

CLINICAL DATA: Constipation.  Feeding tube in place.

EXAM:
ABDOMEN - 2 VIEW

[view not recorded (1 of 2)]
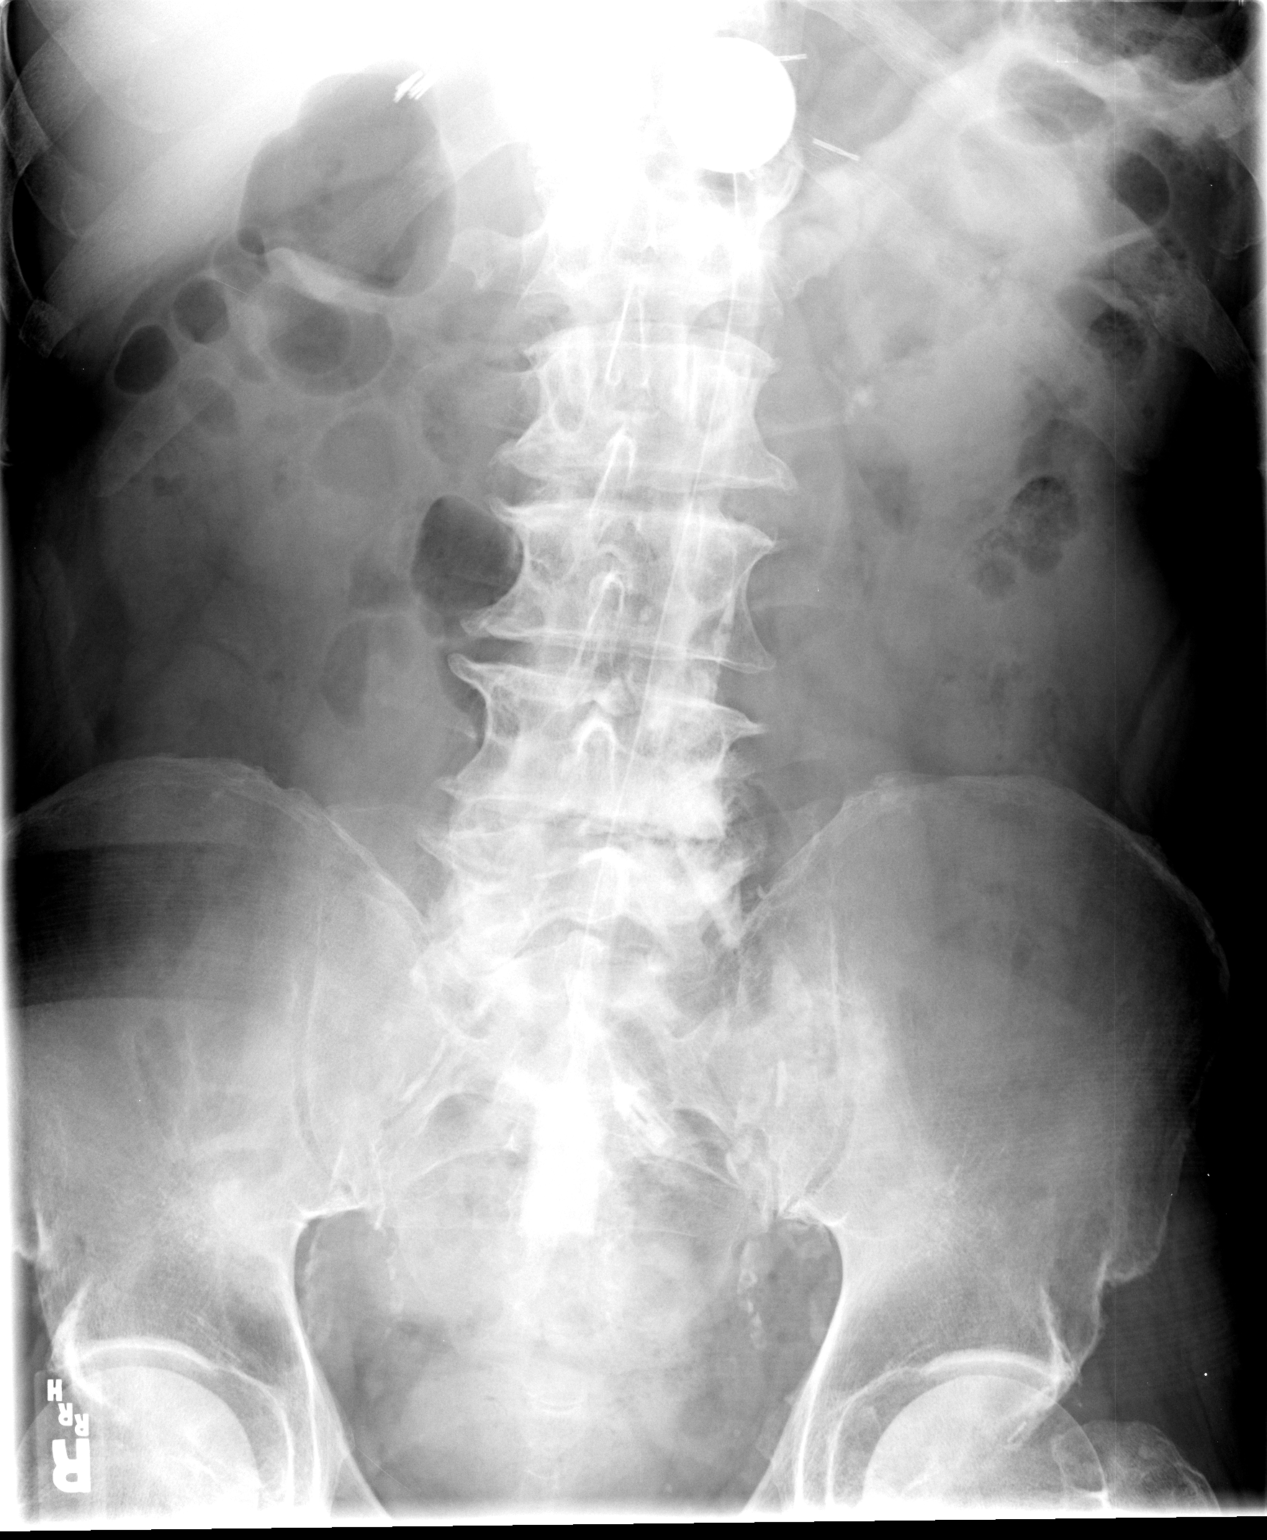

[view not recorded (2 of 2)]
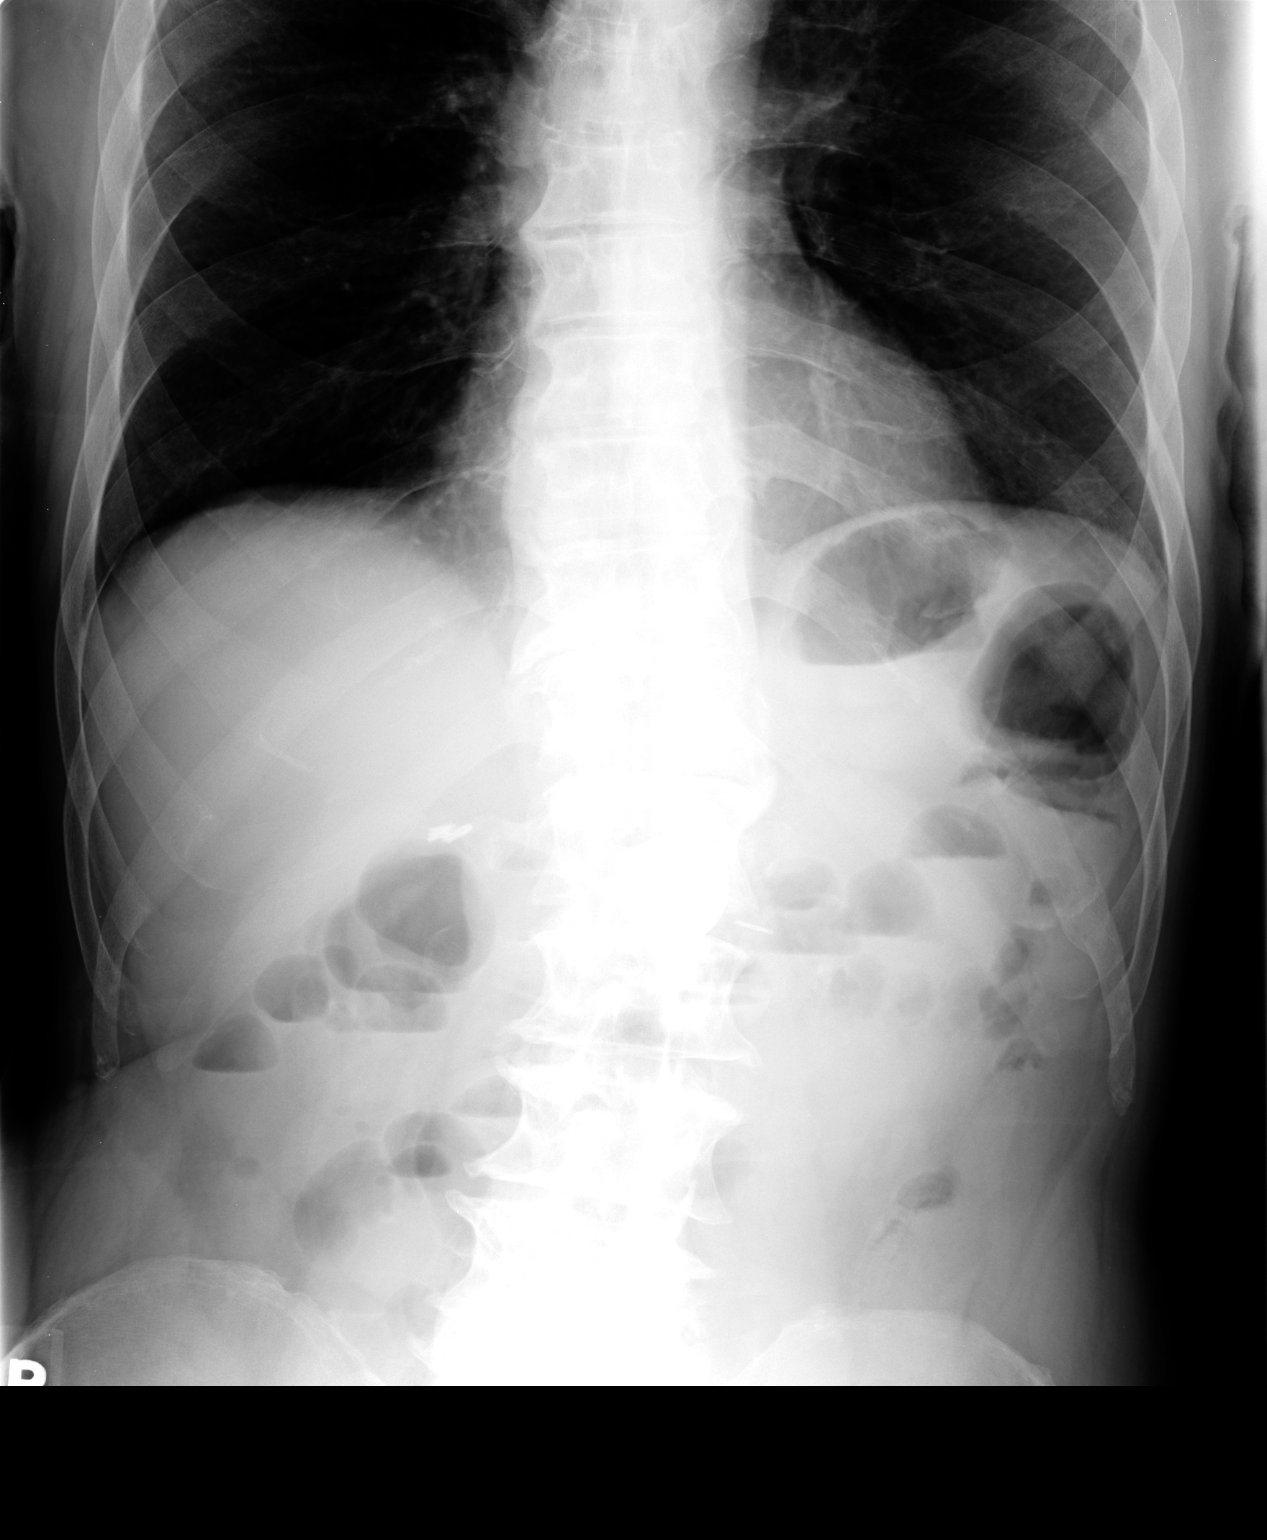

[2 of 2 positions shown; findings below may reference images not displayed]

FINDINGS: Percutaneous gastrostomy tube is in place. Visualized bowel gas
pattern is nonobstructive. Moderate amount of retained stool present
within the colon, suggesting constipation. No free intraperitoneal
air. Scattered surgical clips overlie the upper abdomen. No soft
tissue mass or abnormal calcification.

The visualized lung bases are clear.

Levoscoliosis with associated multilevel degenerative changes noted
within the lower spine. No acute osseous abnormality.
IMPRESSION: 1. Nonobstructive bowel gas pattern. Percutaneous gastrostomy tube
in place.
2. Moderate amount of retained stool within the colon, suggesting
constipation.

## 2015-10-22 IMAGING — CR DG ABDOMEN 2V
2 series · 2 of 2 positions shown · non-contrast
Comparison: 03/15/2014

CLINICAL DATA: Gastrostomy tube replacement

EXAM:
ABDOMEN - 2 VIEW

[view not recorded (1 of 2)]
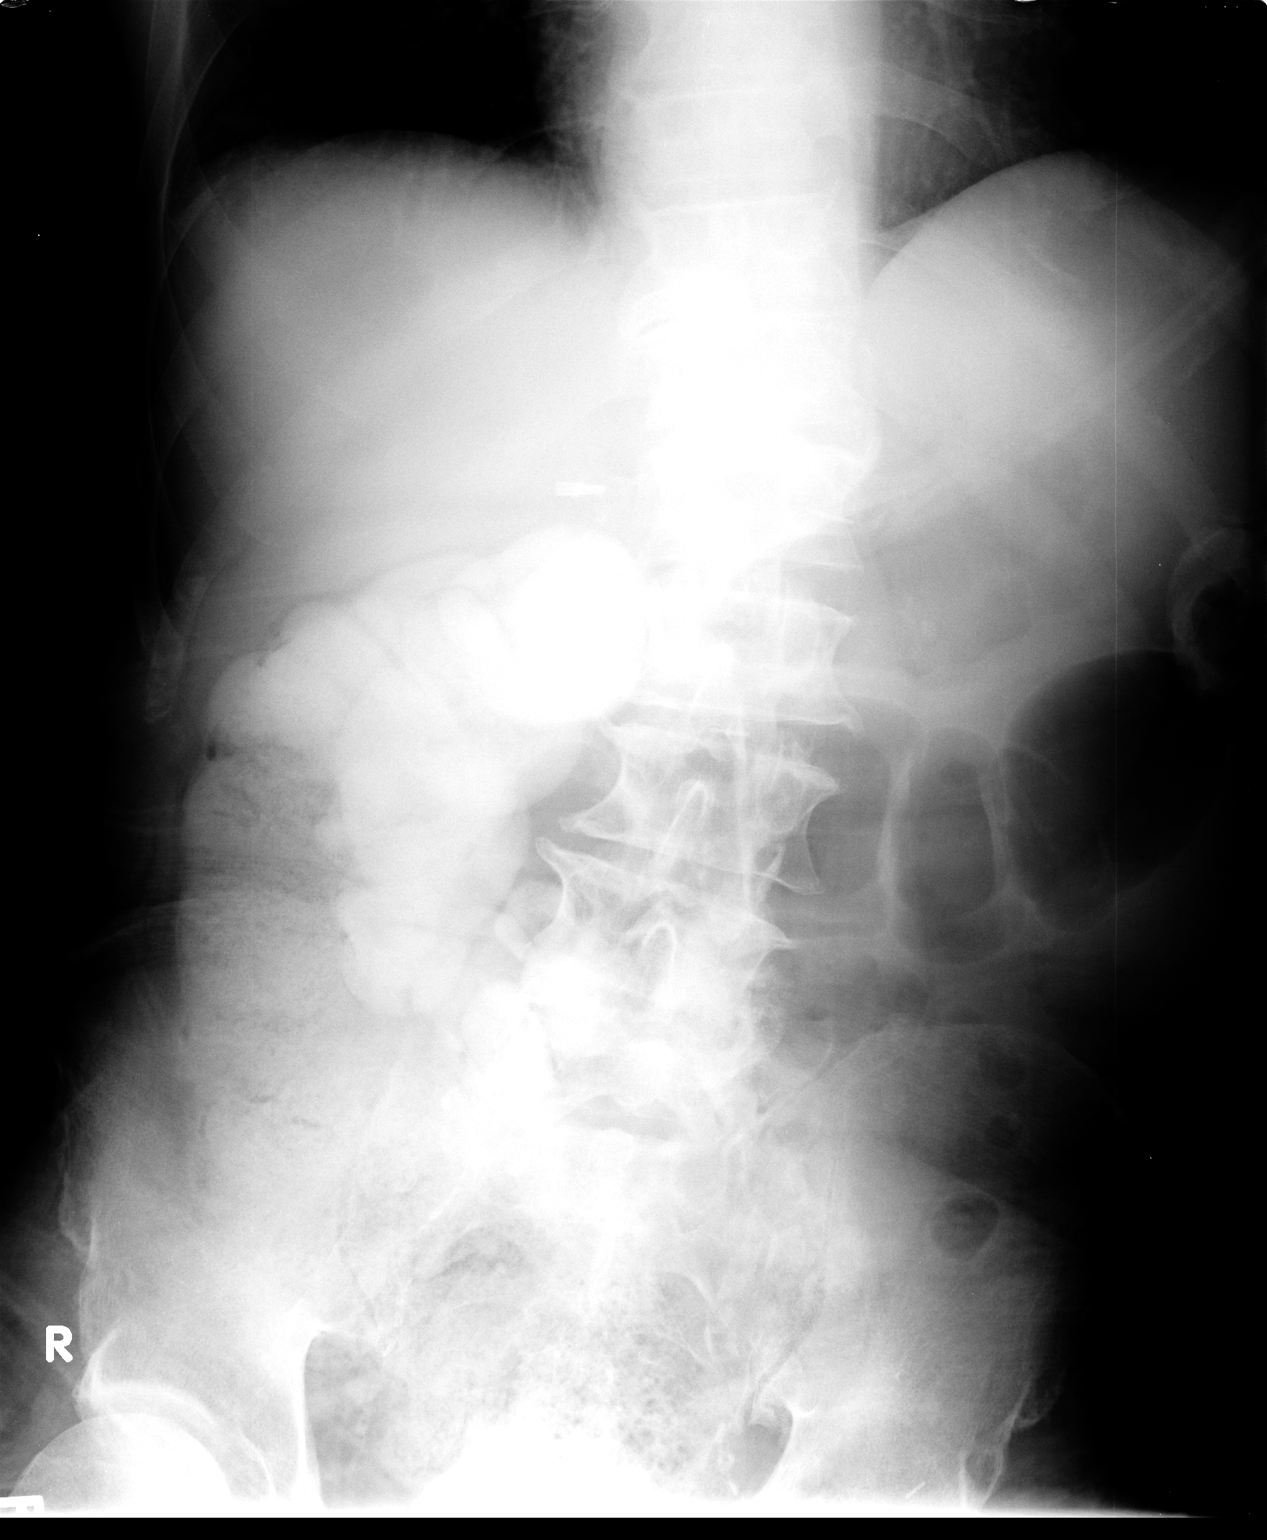

[view not recorded (2 of 2)]
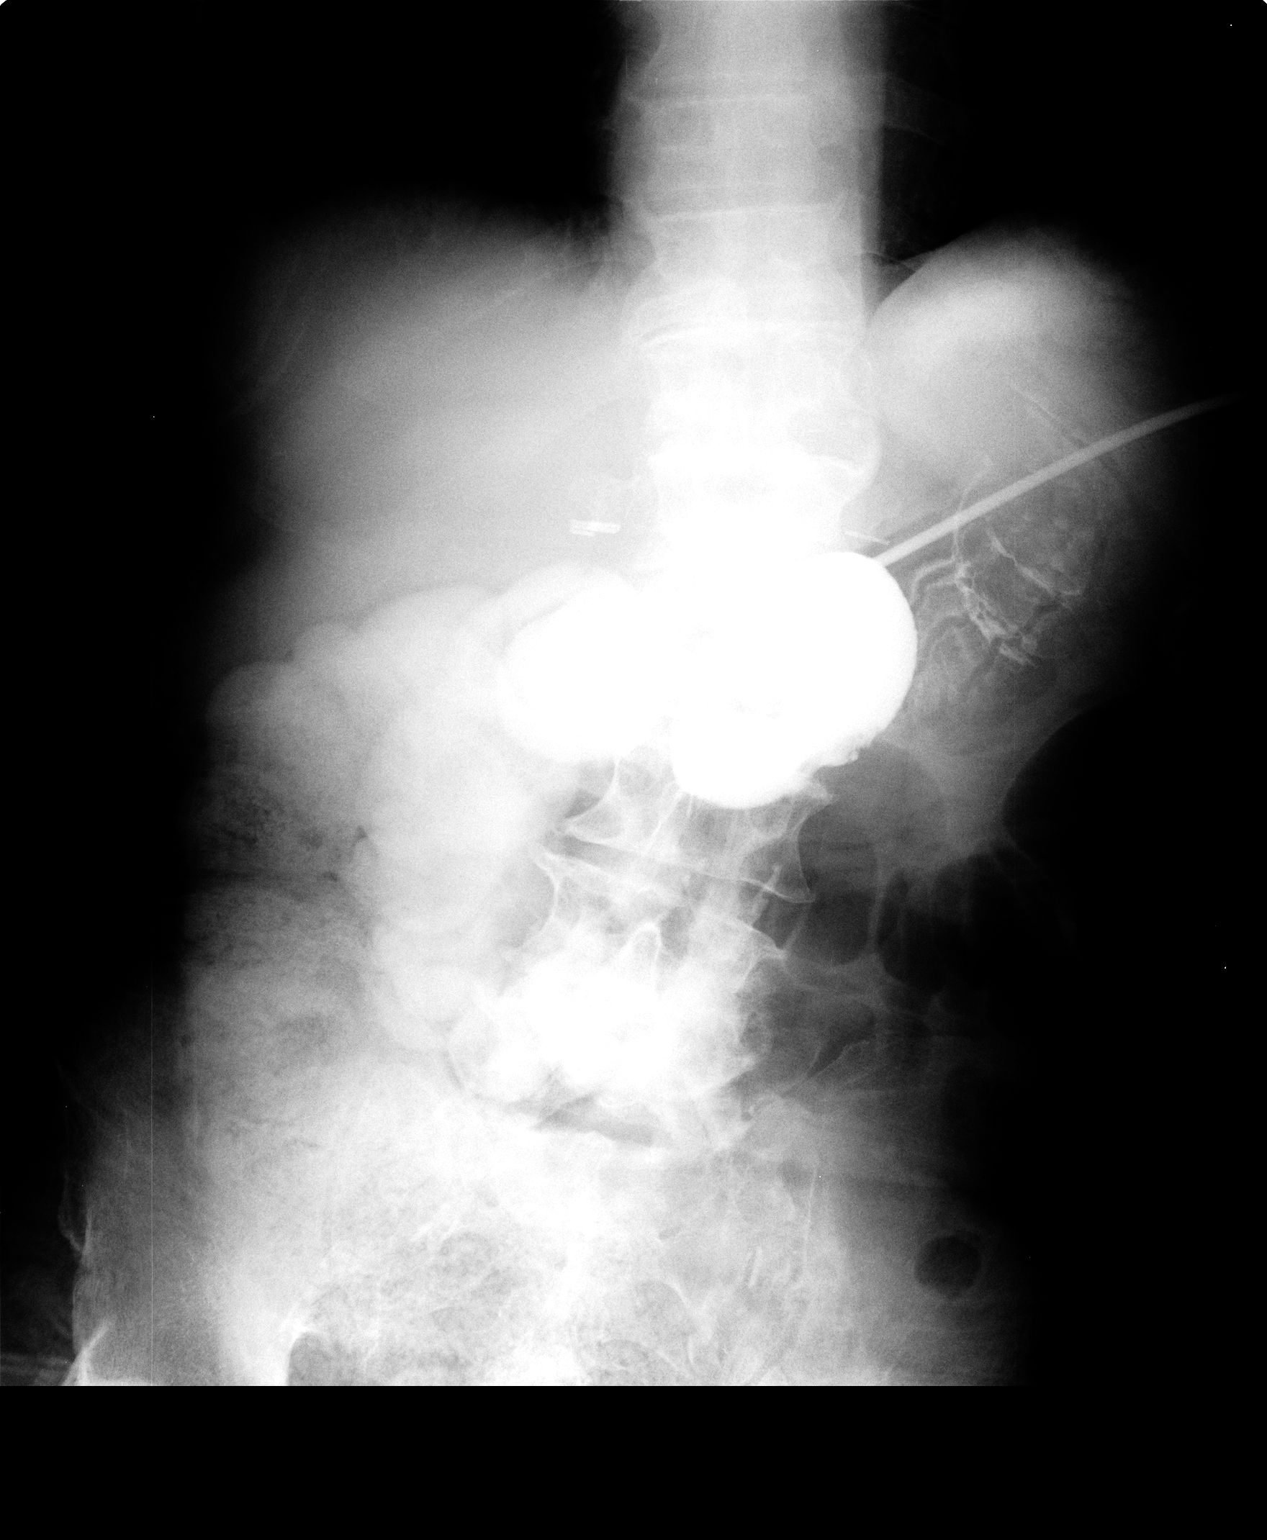

[2 of 2 positions shown; findings below may reference images not displayed]

FINDINGS: 40 cc of contrast was injected through the gastrostomy tube. This
demonstrates opacification of the stomach. No extravasation.

Scout image demonstrates contrast material within the right colon.
No evidence of bowel obstruction or, organomegaly or free air. Prior
cholecystectomy.
IMPRESSION: Gastrostomy tube located within the stomach.
